# Patient Record
Sex: Female | Born: 1995 | Race: White | Hispanic: No | Smoking: Current every day smoker
Health system: Southern US, Community
[De-identification: ages and names within clinical notes are randomized; demographics above are authoritative.]

## PROBLEM LIST (undated history)

## (undated) DIAGNOSIS — F32A Depression, unspecified: Secondary | ICD-10-CM

## (undated) DIAGNOSIS — F909 Attention-deficit hyperactivity disorder, unspecified type: Secondary | ICD-10-CM

## (undated) HISTORY — PX: COLONOSCOPY: SHX174

---

## 2017-02-13 ENCOUNTER — Emergency Department
Admission: EM | Admit: 2017-02-13 | Discharge: 2017-02-13 | Disposition: A | Discharge status: Home / Self Care | Payer: Medicaid Other | Attending: Emergency Medicine | Admitting: Emergency Medicine

## 2017-02-13 DIAGNOSIS — K529 Noninfective gastroenteritis and colitis, unspecified: Secondary | ICD-10-CM

## 2017-02-13 DIAGNOSIS — R197 Diarrhea, unspecified: Secondary | ICD-10-CM | POA: Diagnosis not present

## 2017-02-13 DIAGNOSIS — K625 Hemorrhage of anus and rectum: Secondary | ICD-10-CM | POA: Diagnosis present

## 2017-02-13 LAB — CBC
HCT: 35.9 % (ref 35.0–47.0)
HEMOGLOBIN: 12 g/dL (ref 12.0–16.0)
MCH: 28.1 pg (ref 26.0–34.0)
MCHC: 33.3 g/dL (ref 32.0–36.0)
MCV: 84.2 fL (ref 80.0–100.0)
Platelets: 371 10*3/uL (ref 150–440)
RBC: 4.26 MIL/uL (ref 3.80–5.20)
RDW: 14.4 % (ref 11.5–14.5)
WBC: 9.6 10*3/uL (ref 3.6–11.0)

## 2017-02-13 LAB — URINALYSIS, COMPLETE (UACMP) WITH MICROSCOPIC
BILIRUBIN URINE: NEGATIVE
Bacteria, UA: NONE SEEN
GLUCOSE, UA: NEGATIVE mg/dL
HGB URINE DIPSTICK: NEGATIVE
KETONES UR: NEGATIVE mg/dL
LEUKOCYTES UA: NEGATIVE
NITRITE: NEGATIVE
PROTEIN: NEGATIVE mg/dL
Specific Gravity, Urine: 1.029 (ref 1.005–1.030)
pH: 6 (ref 5.0–8.0)

## 2017-02-13 LAB — COMPREHENSIVE METABOLIC PANEL
ALK PHOS: 73 U/L (ref 38–126)
ALT: 21 U/L (ref 14–54)
ANION GAP: 8 (ref 5–15)
AST: 19 U/L (ref 15–41)
Albumin: 3.9 g/dL (ref 3.5–5.0)
BUN: 11 mg/dL (ref 6–20)
CALCIUM: 9.2 mg/dL (ref 8.9–10.3)
CO2: 28 mmol/L (ref 22–32)
Chloride: 105 mmol/L (ref 101–111)
Creatinine, Ser: 0.79 mg/dL (ref 0.44–1.00)
GFR calc non Af Amer: >60 mL/min (ref >60)
Glucose, Bld: 89 mg/dL (ref 65–99)
Potassium: 3.9 mmol/L (ref 3.5–5.1)
SODIUM: 141 mmol/L (ref 135–145)
TOTAL PROTEIN: 7.8 g/dL (ref 6.5–8.1)
Total Bilirubin: 0.4 mg/dL (ref 0.3–1.2)

## 2017-02-13 LAB — POCT PREGNANCY, URINE: Preg Test, Ur: NEGATIVE

## 2017-02-13 LAB — LIPASE, BLOOD: Lipase: 31 U/L (ref 11–51)

## 2017-02-13 MED ORDER — SODIUM CHLORIDE 0.9 % IV BOLUS (SEPSIS)
1000.0000 mL | Freq: Once | INTRAVENOUS | Status: AC
  Administered 2017-02-13: 1000 mL via INTRAVENOUS

## 2017-02-13 NOTE — ED Notes (Signed)
Pt ambulated to waiting area. Protocols sent to lab.

## 2017-02-13 NOTE — ED Provider Notes (Signed)
[facility] Emergency Department Provider Note  ____________________________________________  Time seen: Approximately 10:16 AM  I have reviewed the triage vital signs and the nursing notes.   HISTORY  Chief Complaint Rectal Bleeding and Abdominal Pain   HPI Frances Walton is a 21 y.o. female no significant past medical history who presents for evaluation of rectal bleeding and diarrhea. Patient reports a year and a half of chronic diarrhea. She has been averaging 6 episodes a day of watery diarrhea. Since July she has been having intermittent episodes of bloody stool. For the last week she reports daily episodes of hematochezia. No melena. Last episode of hematochezia was 2 days ago. She has had nausea but no vomiting. She takes NSAIDs occasionally for headaches. No history of peptic ulcer disease. Patient is under a lot of stress. She has not been able to identify any food triggers. No history of C. difficile or recent antibiotics. She has tried Imodium which helps for about 10 hours but diarrhea then recurs. No family history of IBS, celiac, or IBD. She has not seen a GI doctor. For the last week she has been feeling more fatigued and has had intermittent episodes of dizziness when she stands up. She also endorses crampy abdominal pain that usually precedes her bowel movements that also been going on for a year and a half. She says that the pain comes on is severe last a few minutes and resolved after she has a bowel movement. The pain is generalized. She has no pain at this time.  No past medical history on file.  There are no active problems to display for this patient.   No past surgical history on file.  Prior to Admission medications   Not on File    Allergies Patient has no known allergies.  No family history on file.  Social History Social History  Substance Use Topics  . Smoking status: Not on file  . Smokeless tobacco: Not on file  .  Alcohol use Not on file    Review of Systems  Constitutional: Negative for fever. + Lightheaded Eyes: Negative for visual changes. ENT: Negative for sore throat. Neck: No neck pain  Cardiovascular: Negative for chest pain. Respiratory: Negative for shortness of breath. Gastrointestinal: + diffuse cramping abdominal pain, nausea, hematochezia and diarrhea. Genitourinary: Negative for dysuria. Musculoskeletal: Negative for back pain. Skin: Negative for rash. Neurological: Negative for headaches, weakness or numbness. Psych: No SI or HI  ____________________________________________   PHYSICAL EXAM:  VITAL SIGNS: ED Triage Vitals  Enc Vitals Group     BP 02/13/17 0921 101/62     Pulse Rate 02/13/17 0921 98     Resp 02/13/17 0921 13     Temp 02/13/17 0921 98.1 F (36.7 C)     Temp Source 02/13/17 0921 Oral     SpO2 02/13/17 0921 98 %     Weight 02/13/17 0921 185 lb (83.9 kg)     Height 02/13/17 0921 5' 11"  (1.803 m)     Head Circumference --      Peak Flow --      Pain Score 02/13/17 0851 2     Pain Loc --      Pain Edu? --      Excl. in [place]? --     Constitutional: Alert and oriented. Well appearing and in no apparent distress. HEENT:      Head: Normocephalic and atraumatic.         Eyes: Conjunctivae are normal. Sclera  is non-icteric.       Mouth/Throat: Mucous membranes are moist.       Neck: Supple with no signs of meningismus. Cardiovascular: Regular rate and rhythm. No murmurs, gallops, or rubs. 2+ symmetrical distal pulses are present in all extremities. No JVD. Respiratory: Normal respiratory effort. Lungs are clear to auscultation bilaterally. No wheezes, crackles, or rhonchi.  Gastrointestinal: Soft, non tender, and non distended with positive bowel sounds. No rebound or guarding. Dark brown stool guaiac negative Musculoskeletal: Nontender with normal range of motion in all extremities. No edema, cyanosis, or erythema of extremities. Neurologic: Normal speech  and language. Face is symmetric. Moving all extremities. No gross focal neurologic deficits are appreciated. Skin: Skin is warm, dry and intact. No rash noted. Psychiatric: Mood and affect are normal. Speech and behavior are normal.  ____________________________________________   LABS (all labs ordered are listed, but only abnormal results are displayed)  Labs Reviewed  URINALYSIS, COMPLETE (UACMP) WITH MICROSCOPIC - Abnormal; Notable for the following:       Result Value   Color, Urine YELLOW (*)    APPearance CLOUDY (*)    Squamous Epithelial / LPF 6-30 (*)    All other components within normal limits  LIPASE, BLOOD  COMPREHENSIVE METABOLIC PANEL  CBC  POC URINE PREG, ED  POCT PREGNANCY, URINE   ____________________________________________  EKG  none  ____________________________________________  RADIOLOGY  none  ____________________________________________   PROCEDURES  Procedure(s) performed: None Procedures Critical Care performed:  None ____________________________________________   INITIAL IMPRESSION / ASSESSMENT AND PLAN / ED COURSE   21 y.o. female no significant past medical history who presents for evaluation of chronic diarrhea, intermittent abdominal cramping, and hematochezia. Patient's symptoms have been ongoing for 1.5 years although hematochezia his more recent. No melena. No NSAID use. Abdomen is soft with no tenderness throughout. Stool dark brown guaiac negative. Vitals are stable. Hemoglobin is normal at 12.0, kidney function and electrolytes are within normal limits. No indication for CT scan at this time based on history or physical exam. I will refer patient to a GI specialist for further evaluation. Discussed signs and symptoms of acute blood loss anemia and recommended that she returns to the emergency room if these develop. Recommended staying away from NSAIDs. Recommended dietary changes including removing lactose and gluten from her diet and  to she is able to see GI.     Pertinent labs & imaging results that were available during my care of the patient were reviewed by me and considered in my medical decision making (see chart for details).    ____________________________________________   FINAL CLINICAL IMPRESSION(S) / ED DIAGNOSES  Final diagnoses:  Chronic diarrhea      NEW MEDICATIONS STARTED DURING THIS VISIT:  New Prescriptions   No medications on file     Note:  This document was prepared using Dragon voice recognition software and may include unintentional dictation errors.    Rudene Re, MD 02/13/17 [id number]

## 2017-02-13 NOTE — Discharge Instructions (Signed)
Follow-up with a GI specialist within the next month. Return to the emergency room if you develop severe fatigue, shortness of breath, if you pass out, or if you are concerned that your bleeding is getting worse.

## 2017-02-13 NOTE — ED Triage Notes (Signed)
Pt reports diarrhea x1 year, reports she's had blood in her stool since july and lower abdominal pain x2 weeks.

## 2017-02-21 ENCOUNTER — Other Ambulatory Visit
Admission: RE | Admit: 2017-02-21 | Discharge: 2017-02-21 | Disposition: A | Discharge status: Home / Self Care | Payer: Medicaid Other | Source: Ambulatory Visit | Attending: Student | Admitting: Student

## 2017-02-21 DIAGNOSIS — K529 Noninfective gastroenteritis and colitis, unspecified: Secondary | ICD-10-CM | POA: Insufficient documentation

## 2017-02-21 LAB — GASTROINTESTINAL PANEL BY PCR, STOOL (REPLACES STOOL CULTURE)
ADENOVIRUS F40/41: NOT DETECTED
ASTROVIRUS: NOT DETECTED
CAMPYLOBACTER SPECIES: NOT DETECTED
CYCLOSPORA CAYETANENSIS: NOT DETECTED
Cryptosporidium: NOT DETECTED
ENTEROPATHOGENIC E COLI (EPEC): DETECTED — AB
ENTEROTOXIGENIC E COLI (ETEC): NOT DETECTED
Entamoeba histolytica: NOT DETECTED
Enteroaggregative E coli (EAEC): NOT DETECTED
Giardia lamblia: NOT DETECTED
NOROVIRUS GI/GII: NOT DETECTED
PLESIMONAS SHIGELLOIDES: NOT DETECTED
Rotavirus A: NOT DETECTED
Salmonella species: NOT DETECTED
Sapovirus (I, II, IV, and V): NOT DETECTED
Shiga like toxin producing E coli (STEC): NOT DETECTED
Shigella/Enteroinvasive E coli (EIEC): NOT DETECTED
VIBRIO CHOLERAE: NOT DETECTED
VIBRIO SPECIES: NOT DETECTED
Yersinia enterocolitica: NOT DETECTED

## 2017-02-24 LAB — MISC LABCORP TEST (SEND OUT): Labcorp test code: 86207

## 2017-03-02 ENCOUNTER — Other Ambulatory Visit
Admission: RE | Admit: 2017-03-02 | Discharge: 2017-03-02 | Disposition: A | Discharge status: Home / Self Care | Payer: Medicaid Other | Source: Ambulatory Visit | Attending: Student | Admitting: Student

## 2017-03-02 DIAGNOSIS — A498 Other bacterial infections of unspecified site: Secondary | ICD-10-CM | POA: Diagnosis not present

## 2017-03-02 LAB — GASTROINTESTINAL PANEL BY PCR, STOOL (REPLACES STOOL CULTURE)
ADENOVIRUS F40/41: NOT DETECTED
Astrovirus: NOT DETECTED
CAMPYLOBACTER SPECIES: NOT DETECTED
CRYPTOSPORIDIUM: NOT DETECTED
CYCLOSPORA CAYETANENSIS: NOT DETECTED
ENTAMOEBA HISTOLYTICA: NOT DETECTED
ENTEROPATHOGENIC E COLI (EPEC): NOT DETECTED
Enteroaggregative E coli (EAEC): NOT DETECTED
Enterotoxigenic E coli (ETEC): NOT DETECTED
Giardia lamblia: NOT DETECTED
Norovirus GI/GII: NOT DETECTED
PLESIMONAS SHIGELLOIDES: NOT DETECTED
Rotavirus A: NOT DETECTED
SAPOVIRUS (I, II, IV, AND V): NOT DETECTED
Salmonella species: NOT DETECTED
Shiga like toxin producing E coli (STEC): NOT DETECTED
Shigella/Enteroinvasive E coli (EIEC): NOT DETECTED
VIBRIO SPECIES: NOT DETECTED
Vibrio cholerae: NOT DETECTED
YERSINIA ENTEROCOLITICA: NOT DETECTED

## 2017-03-03 LAB — CALPROTECTIN, FECAL: Calprotectin, Fecal: 815 ug/g — ABNORMAL HIGH (ref 0–120)

## 2017-05-30 ENCOUNTER — Other Ambulatory Visit
Admission: RE | Admit: 2017-05-30 | Discharge: 2017-05-30 | Disposition: A | Discharge status: Home / Self Care | Payer: Medicaid Other | Source: Ambulatory Visit | Attending: Student | Admitting: Student

## 2017-05-30 DIAGNOSIS — R197 Diarrhea, unspecified: Secondary | ICD-10-CM | POA: Diagnosis not present

## 2017-05-30 LAB — GASTROINTESTINAL PANEL BY PCR, STOOL (REPLACES STOOL CULTURE)

## 2017-05-30 LAB — C DIFFICILE QUICK SCREEN W PCR REFLEX
C DIFFICILE (CDIFF) TOXIN: NEGATIVE
C Diff antigen: POSITIVE — AB

## 2017-05-30 LAB — CLOSTRIDIUM DIFFICILE BY PCR: Toxigenic C. Difficile by PCR: POSITIVE — AB

## 2017-06-02 LAB — CALPROTECTIN, FECAL: Calprotectin, Fecal: 338 ug/g — ABNORMAL HIGH (ref 0–120)

## 2018-02-03 ENCOUNTER — Other Ambulatory Visit
Admission: RE | Admit: 2018-02-03 | Discharge: 2018-02-03 | Disposition: A | Discharge status: Home / Self Care | Payer: Medicaid Other | Source: Ambulatory Visit | Attending: Student | Admitting: Student

## 2018-02-03 DIAGNOSIS — K51519 Left sided colitis with unspecified complications: Secondary | ICD-10-CM | POA: Insufficient documentation

## 2018-02-03 LAB — C DIFFICILE QUICK SCREEN W PCR REFLEX
C Diff antigen: POSITIVE — AB
C Diff toxin: NEGATIVE

## 2018-02-03 LAB — CLOSTRIDIUM DIFFICILE BY PCR, REFLEXED: Toxigenic C. Difficile by PCR: POSITIVE — AB

## 2018-02-07 LAB — CALPROTECTIN, FECAL: Calprotectin, Fecal: 79 ug/g (ref 0–120)

## 2020-03-19 ENCOUNTER — Other Ambulatory Visit: Payer: Self-pay

## 2020-03-19 ENCOUNTER — Other Ambulatory Visit
Admission: RE | Admit: 2020-03-19 | Discharge: 2020-03-19 | Disposition: A | Discharge status: Home / Self Care | Payer: Medicaid Other | Source: Ambulatory Visit | Attending: Gastroenterology | Admitting: Gastroenterology

## 2020-03-19 DIAGNOSIS — Z20822 Contact with and (suspected) exposure to covid-19: Secondary | ICD-10-CM | POA: Diagnosis not present

## 2020-03-19 DIAGNOSIS — Z01812 Encounter for preprocedural laboratory examination: Secondary | ICD-10-CM | POA: Diagnosis present

## 2020-03-20 ENCOUNTER — Encounter: Payer: Self-pay | Admitting: *Deleted

## 2020-03-20 LAB — SARS CORONAVIRUS 2 (TAT 6-24 HRS): SARS Coronavirus 2: NEGATIVE

## 2020-03-21 ENCOUNTER — Emergency Department
Admission: EM | Admit: 2020-03-21 | Discharge: 2020-03-21 | Disposition: A | Discharge status: Home / Self Care | Payer: Medicaid Other | Attending: Emergency Medicine | Admitting: Emergency Medicine

## 2020-03-21 ENCOUNTER — Other Ambulatory Visit: Payer: Self-pay

## 2020-03-21 ENCOUNTER — Emergency Department: Payer: Medicaid Other

## 2020-03-21 DIAGNOSIS — W19XXXA Unspecified fall, initial encounter: Secondary | ICD-10-CM | POA: Insufficient documentation

## 2020-03-21 DIAGNOSIS — Z91040 Latex allergy status: Secondary | ICD-10-CM | POA: Diagnosis not present

## 2020-03-21 DIAGNOSIS — R300 Dysuria: Secondary | ICD-10-CM | POA: Insufficient documentation

## 2020-03-21 DIAGNOSIS — S93401A Sprain of unspecified ligament of right ankle, initial encounter: Secondary | ICD-10-CM | POA: Insufficient documentation

## 2020-03-21 DIAGNOSIS — N3090 Cystitis, unspecified without hematuria: Secondary | ICD-10-CM | POA: Diagnosis not present

## 2020-03-21 DIAGNOSIS — T1490XA Injury, unspecified, initial encounter: Secondary | ICD-10-CM

## 2020-03-21 DIAGNOSIS — N390 Urinary tract infection, site not specified: Secondary | ICD-10-CM

## 2020-03-21 DIAGNOSIS — S99911A Unspecified injury of right ankle, initial encounter: Secondary | ICD-10-CM | POA: Diagnosis present

## 2020-03-21 LAB — POCT PREGNANCY, URINE: Preg Test, Ur: NEGATIVE

## 2020-03-21 LAB — URINALYSIS, ROUTINE W REFLEX MICROSCOPIC
Glucose, UA: NEGATIVE mg/dL
Hgb urine dipstick: NEGATIVE
Ketones, ur: NEGATIVE mg/dL
Leukocytes,Ua: NEGATIVE
Nitrite: NEGATIVE
Protein, ur: 100 mg/dL — AB
Specific Gravity, Urine: 1.041 — ABNORMAL HIGH (ref 1.005–1.030)
pH: 5 (ref 5.0–8.0)

## 2020-03-21 IMAGING — CR DG ANKLE COMPLETE 3+V*R*
1 series · 3 of 3 positions shown · non-contrast
Comparison: None.

CLINICAL DATA: Twisted right ankle

EXAM:
RIGHT ANKLE - COMPLETE 3+ VIEW

[Series 1: dg ankle complete right · 0.14mm/px · 3 of 3 slices shown]
[im 1/3]
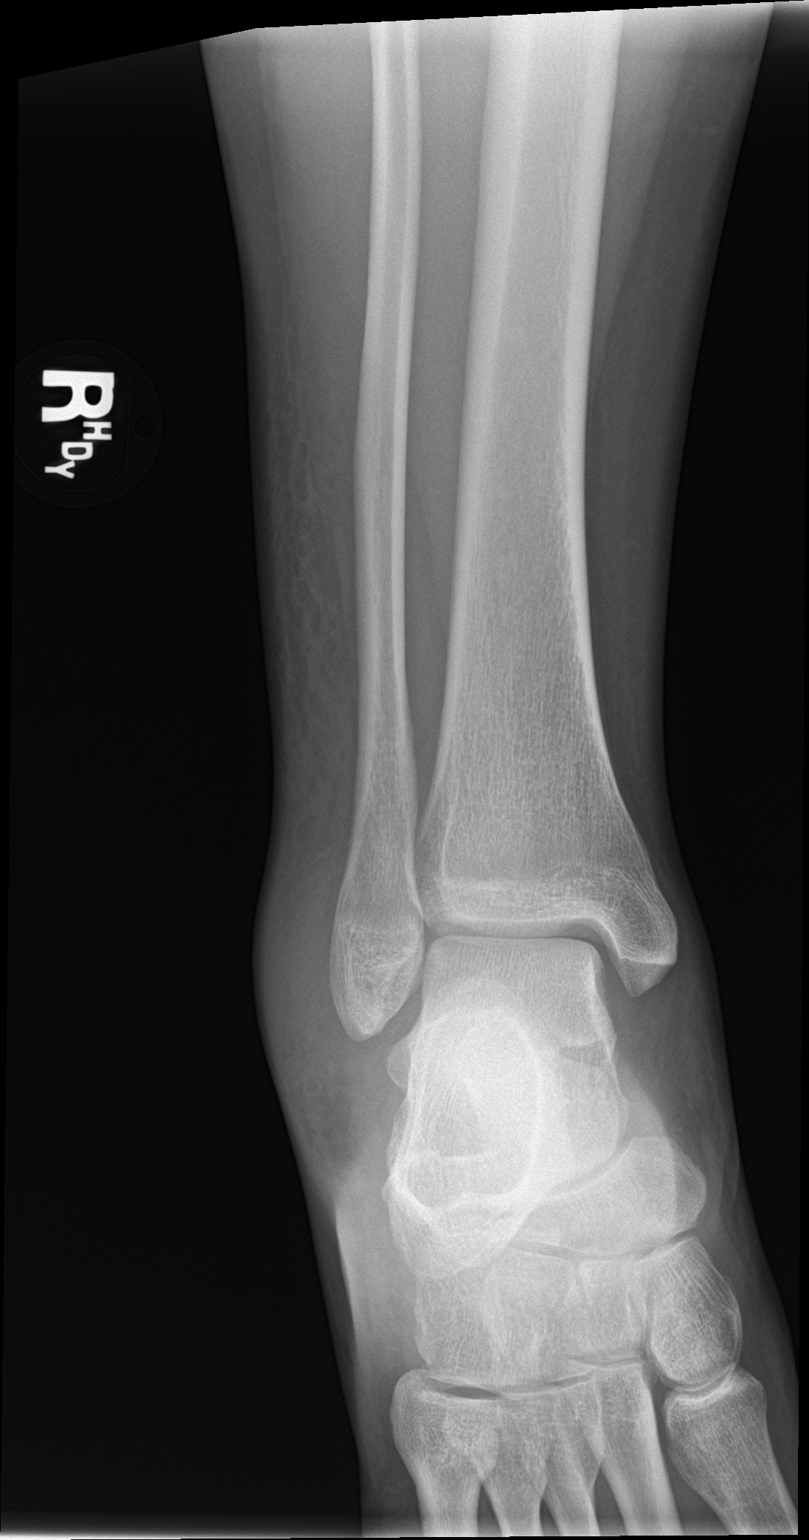
[im 2/3]
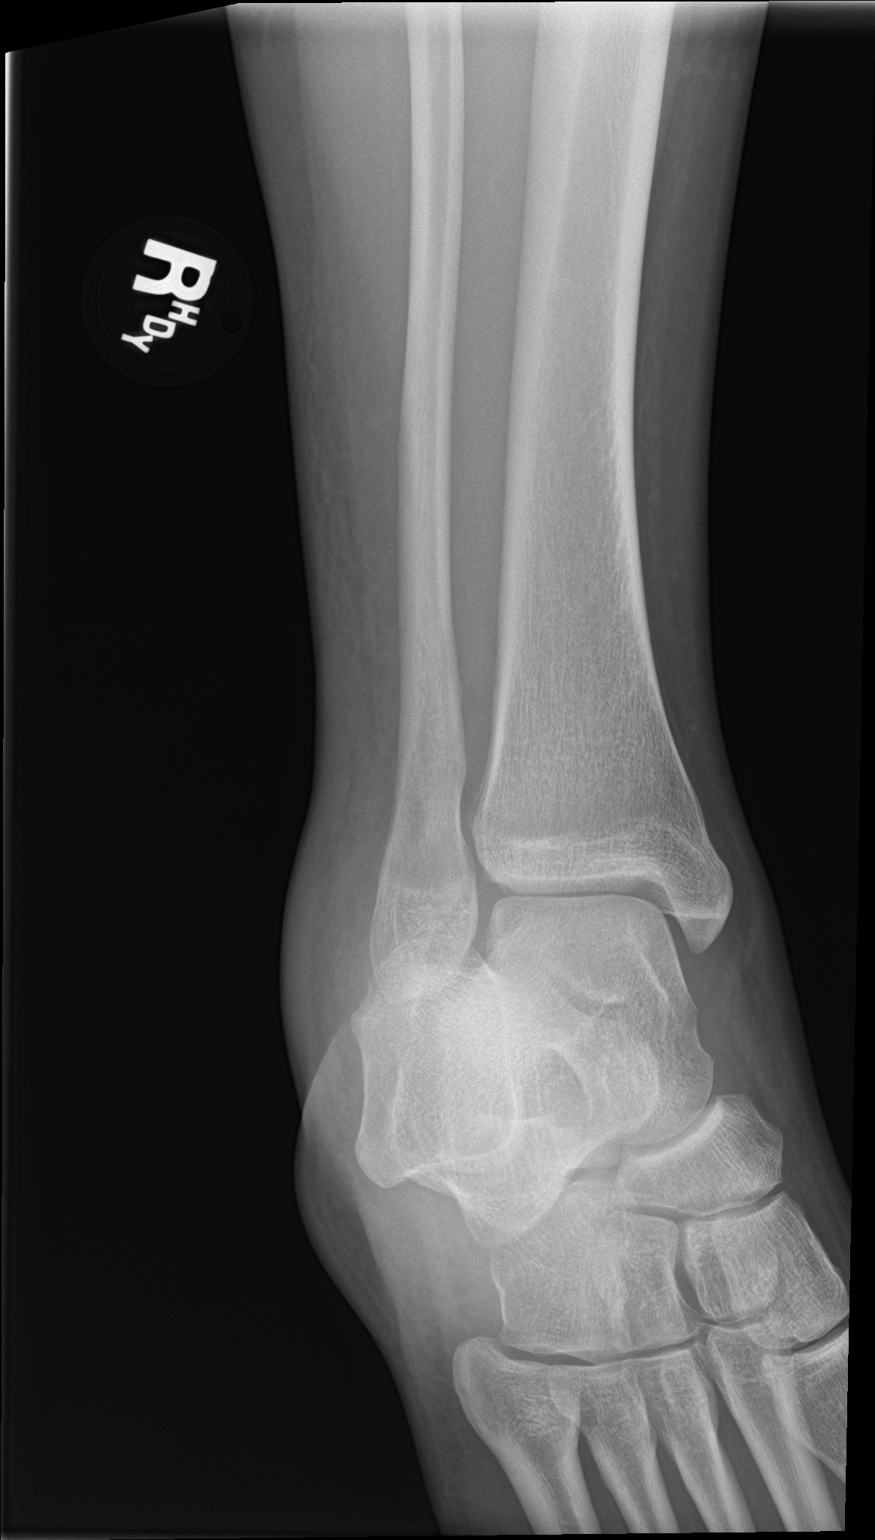
[im 3/3]
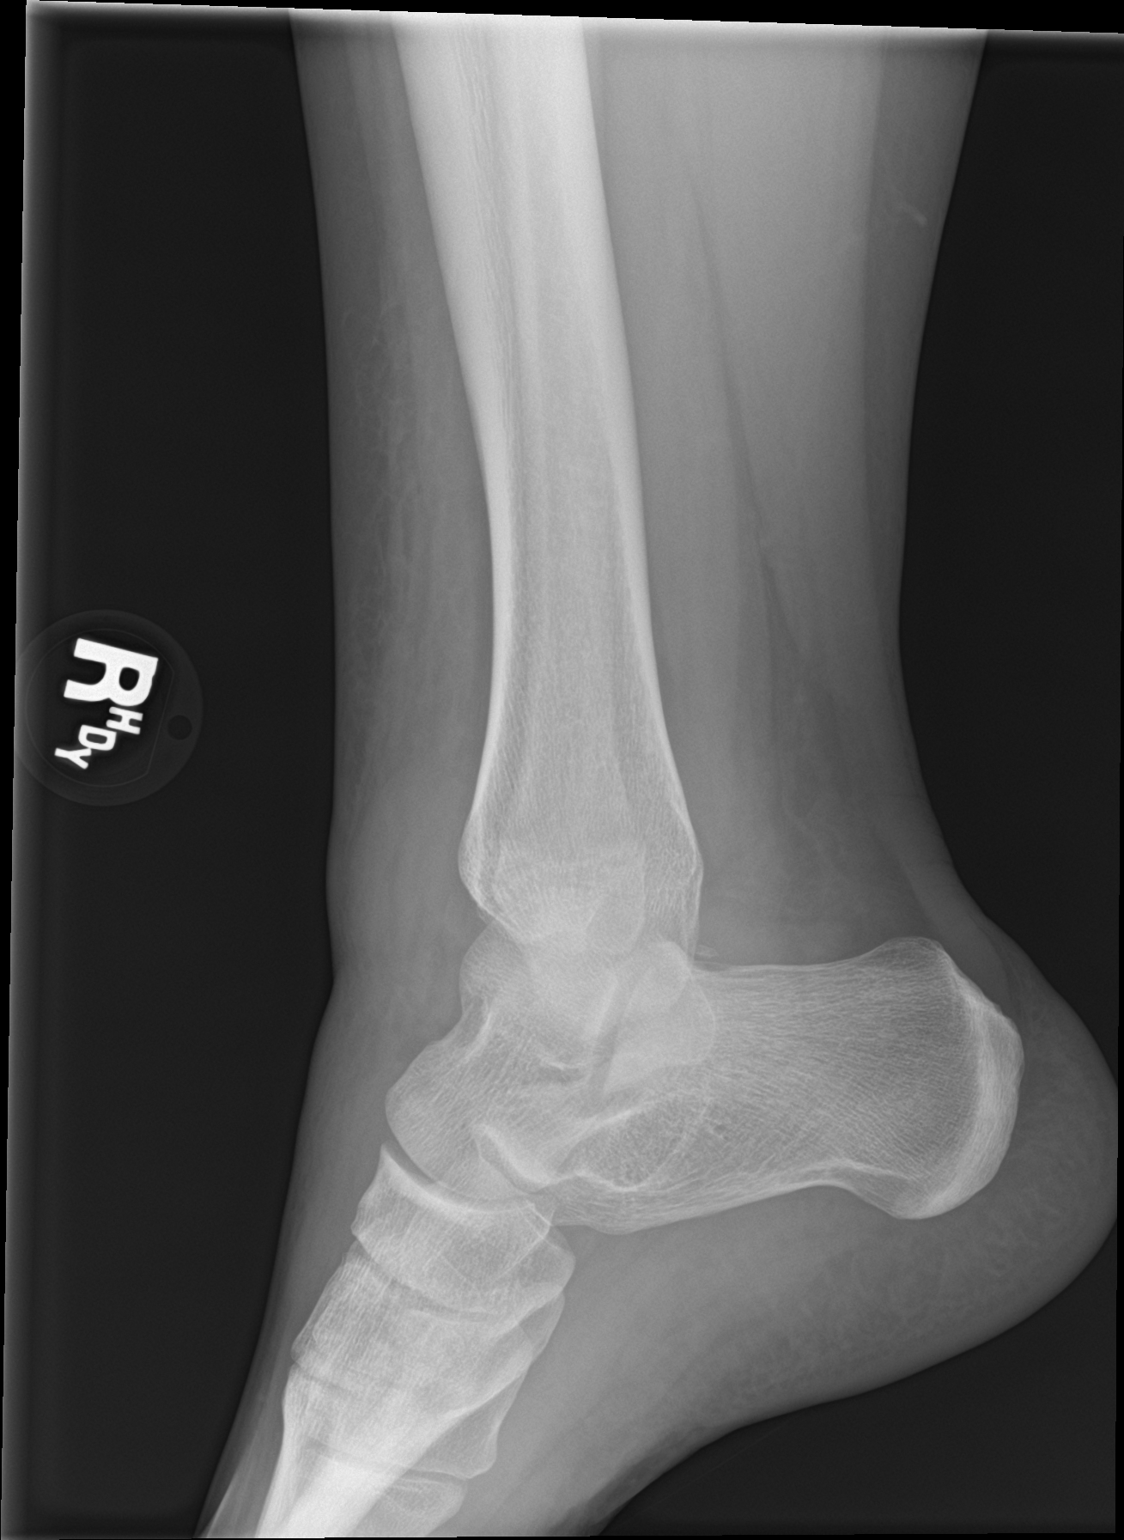

[3 of 3 positions shown; findings below may reference images not displayed]

FINDINGS: There is no evidence of fracture, dislocation, or joint effusion.
There is no evidence of arthropathy or other focal bone abnormality.
Diffuse soft tissue swelling seen along the lateral malleolus. A
small ankle joint effusion is present.
IMPRESSION: No acute osseous abnormality.

## 2020-03-21 MED ORDER — ONDANSETRON 8 MG PO TBDP
8.0000 mg | ORAL_TABLET | Freq: Once | ORAL | Status: AC
  Administered 2020-03-21: 8 mg via ORAL
  Filled 2020-03-21: qty 1

## 2020-03-21 MED ORDER — ONDANSETRON HCL 8 MG PO TABS: 8.0000 mg | ORAL_TABLET | Freq: Three times a day (TID) | ORAL | 0 refills | Status: DC | PRN

## 2020-03-21 MED ORDER — HYDROMORPHONE HCL 1 MG/ML IJ SOLN
1.0000 mg | Freq: Once | INTRAMUSCULAR | Status: AC
  Administered 2020-03-21: 1 mg via INTRAMUSCULAR
  Filled 2020-03-21: qty 1

## 2020-03-21 MED ORDER — FLUCONAZOLE 150 MG PO TABS: 150.0000 mg | ORAL_TABLET | Freq: Every day | ORAL | 0 refills | Status: DC

## 2020-03-21 MED ORDER — SULFAMETHOXAZOLE-TRIMETHOPRIM 800-160 MG PO TABS: 1.0000 | ORAL_TABLET | Freq: Two times a day (BID) | ORAL | 0 refills | Status: DC

## 2020-03-21 MED ORDER — PHENAZOPYRIDINE HCL 200 MG PO TABS: 200.0000 mg | ORAL_TABLET | Freq: Three times a day (TID) | ORAL | 0 refills | Status: DC | PRN

## 2020-03-21 MED ORDER — OXYCODONE-ACETAMINOPHEN 7.5-325 MG PO TABS
1.0000 | ORAL_TABLET | Freq: Four times a day (QID) | ORAL | 0 refills | Status: DC | PRN
Start: 2020-03-21 — End: 2021-03-30

## 2020-03-21 NOTE — ED Triage Notes (Signed)
Pt comes with right ankle pain and swelling after a fall yesterday

## 2020-03-21 NOTE — ED Notes (Signed)
See triage note- pt fell down on a steep drive way onto right ankle. Ankle is swollen and bruised, unable to bare weight on ankle.   Pt also thinks she has a UTI, states she has dysuria and urinary frequency since yesterday.

## 2020-03-21 NOTE — Discharge Instructions (Signed)
Follow discharge care instruction.  Wear splint and ambulate with crutches for 2 to 3 days.  Follow-up with orthopedic if no improvement or worsening of ankle pain.  Be advised medication may cause drowsiness.

## 2020-03-21 NOTE — ED Provider Notes (Signed)
[facility] Emergency Department Provider Note   ____________________________________________   First MD Initiated Contact with Patient 03/21/20 1617     (approximate)  I have reviewed the triage vital signs and the nursing notes.   HISTORY  Chief Complaint Ankle Pain    HPI Frances Walton is a 24 y.o. female patient complaining right ankle pain and swelling secondary to fall yesterday.  Patient unable to bear weight today secondary to pain.  Patient denies loss sensation loss of movement.  LOC or head injury status post fall.  Patient rates pain as 7/10.  Patient described the pain as "achy".  Ice pack applied in triage.         History reviewed. No pertinent past medical history.  There are no problems to display for this patient.   History reviewed. No pertinent surgical history.  Prior to Admission medications   Medication Sig Start Date End Date Taking? Authorizing Provider  buPROPion (WELLBUTRIN SR) 100 MG 12 hr tablet Take 100 mg by mouth 2 (two) times daily.    [provider]  fluconazole (DIFLUCAN) 150 MG tablet Take 1 tablet (150 mg total) by mouth daily. 03/21/20   Sable Feil, PA-C  mesalamine (LIALDA) 1.2 g EC tablet Take 1.2 g by mouth daily with breakfast.    [provider]  oxyCODONE-acetaminophen (PERCOCET) 7.5-325 MG tablet Take 1 tablet by mouth every 6 (six) hours as needed. 03/21/20   Sable Feil, PA-C  phenazopyridine (PYRIDIUM) 200 MG tablet Take 1 tablet (200 mg total) by mouth 3 (three) times daily as needed for pain. 03/21/20   Sable Feil, PA-C  sulfamethoxazole-trimethoprim (BACTRIM DS) 800-160 MG tablet Take 1 tablet by mouth 2 (two) times daily. 03/21/20   Sable Feil, PA-C    Allergies Latex and Nickel  History reviewed. No pertinent family history.  Social History Social History   Tobacco Use   Smoking status: Not on file  Substance Use Topics   Alcohol use: Not on file    Drug use: Not on file    Review of Systems Constitutional: No fever/chills Eyes: No visual changes. ENT: No sore throat. Cardiovascular: Denies chest pain. Respiratory: Denies shortness of breath. Gastrointestinal: No abdominal pain.  No nausea, no vomiting.  No diarrhea.  No constipation.  History of ulcerative colitis. Genitourinary: Positive for dysuria. Musculoskeletal: Negative for back pain. Skin: Negative for rash. Neurological: Negative for headaches, focal weakness or numbness. Allergic/Immunilogical: Latex and nickel. ____________________________________________   PHYSICAL EXAM:  VITAL SIGNS: ED Triage Vitals  Enc Vitals Group     BP 03/21/20 1601 108/60     Pulse Rate 03/21/20 1601 83     Resp 03/21/20 1601 16     Temp 03/21/20 1601 98.2 F (36.8 C)     Temp Source 03/21/20 1601 Oral     SpO2 03/21/20 1601 99 %     Weight 03/21/20 1600 180 lb (81.6 kg)     Height 03/21/20 1600 5' 11"  (1.803 m)     Head Circumference --      Peak Flow --      Pain Score 03/21/20 1604 7     Pain Loc --      Pain Edu? --      Excl. in [place]? --     Constitutional: Alert and oriented. Well appearing and in no acute distress.  Anxious. Neck: No cervical spine tenderness to palpation. Hematological/Lymphatic/Immunilogical: No cervical lymphadenopathy. Cardiovascular: Normal rate, regular rhythm. Grossly  normal heart sounds.  Good peripheral circulation. Respiratory: Normal respiratory effort.  No retractions. Lungs CTAB. Musculoskeletal: No obvious deformity to the right ankle.  Patient has moderate edema to the lateral ankle. Neurologic:  Normal speech and language. No gross focal neurologic deficits are appreciated. No gait instability. Skin:  Skin is warm, dry and intact. No rash noted.  No abrasion or ecchymosis. Psychiatric: Mood and affect are normal. Speech and behavior are normal.  ____________________________________________   LABS (all labs ordered are listed, but only  abnormal results are displayed)  Labs Reviewed  URINALYSIS, ROUTINE W REFLEX MICROSCOPIC - Abnormal; Notable for the following components:      Result Value   Color, Urine YELLOW (*)    APPearance TURBID (*)    Specific Gravity, Urine 1.041 (*)    Bilirubin Urine SMALL (*)    Protein, ur 100 (*)    Bacteria, UA FEW (*)    All other components within normal limits  POCT PREGNANCY, URINE   ____________________________________________  EKG   ____________________________________________  RADIOLOGY  ED MD interpretation:    Official radiology report(s): DG Ankle Complete Right  Result Date: 03/21/2020 CLINICAL DATA:  Twisted right ankle EXAM: RIGHT ANKLE - COMPLETE 3+ VIEW COMPARISON:  None. FINDINGS: There is no evidence of fracture, dislocation, or joint effusion. There is no evidence of arthropathy or other focal bone abnormality. Diffuse soft tissue swelling seen along the lateral malleolus. A small ankle joint effusion is present. IMPRESSION: No acute osseous abnormality. Electronically Signed   By: Prudencio Pair M.D.   On: 03/21/2020 16:41    ____________________________________________   PROCEDURES  Procedure(s) performed (including Critical Care):  Procedures   ____________________________________________   INITIAL IMPRESSION / ASSESSMENT AND PLAN / ED COURSE  As part of my medical decision making, I reviewed the following data within the [place]     Patient presents with right ankle pain secondary to fall.  Discussed x-ray findings with patient.  Patient complaint physical and exam consistent with ankle sprain.  Patient placed in a ankle splint and given crutches to assist with ambulation.  Patient given a work note for 3 days.  Patient advised follow-up orthopedic if no improvement in 3 days.  Patient also was found to have a urinary tract infection.  Patient given prescription for Bactrim DS, Pyridium, and Diflucan.  Take medication as  directed.          ____________________________________________   FINAL CLINICAL IMPRESSION(S) / ED DIAGNOSES  Final diagnoses:  Injury  Sprain of right ankle, unspecified ligament, initial encounter  Urinary tract infection without hematuria, site unspecified     ED Discharge Orders         Ordered    oxyCODONE-acetaminophen (PERCOCET) 7.5-325 MG tablet  Every 6 hours PRN        03/21/20 1719    sulfamethoxazole-trimethoprim (BACTRIM DS) 800-160 MG tablet  2 times daily        03/21/20 1756    phenazopyridine (PYRIDIUM) 200 MG tablet  3 times daily PRN        03/21/20 1756    fluconazole (DIFLUCAN) 150 MG tablet  Daily        03/21/20 1756          *Please note:  Avian Greenawalt was evaluated in Emergency Department on 03/21/2020 for the symptoms described in the history of present illness. She was evaluated in the context of the global COVID-19 pandemic, which necessitated consideration that the patient might be  at risk for infection with the SARS-CoV-2 virus that causes COVID-19. Institutional protocols and algorithms that pertain to the evaluation of patients at risk for COVID-19 are in a state of rapid change based on information released by regulatory bodies including the CDC and federal and state organizations. These policies and algorithms were followed during the patient's care in the ED.  Some ED evaluations and interventions may be delayed as a result of limited staffing during and the pandemic.*   Note:  This document was prepared using Dragon voice recognition software and may include unintentional dictation errors.    Sable Feil, PA-C 03/21/20 1800    Harvest Dark, MD 03/21/20 2057

## 2020-03-23 ENCOUNTER — Encounter: Payer: Self-pay | Admitting: Anesthesiology

## 2020-03-23 ENCOUNTER — Ambulatory Visit: Admission: RE | Admit: 2020-03-23 | Payer: Medicaid Other | Source: Home / Self Care

## 2020-03-23 MED ORDER — PROPOFOL 500 MG/50ML IV EMUL
INTRAVENOUS | Status: AC
  Filled 2020-03-23: qty 50

## 2020-03-23 MED ORDER — MIDAZOLAM HCL 2 MG/2ML IJ SOLN
INTRAMUSCULAR | Status: AC
  Filled 2020-03-23: qty 2

## 2020-05-06 ENCOUNTER — Other Ambulatory Visit: Admission: RE | Admit: 2020-05-06 | Payer: Medicaid Other | Source: Ambulatory Visit

## 2020-05-08 ENCOUNTER — Encounter: Admission: RE | Payer: Self-pay | Source: Home / Self Care

## 2020-05-08 ENCOUNTER — Ambulatory Visit: Admission: RE | Admit: 2020-05-08 | Payer: Medicaid Other | Source: Home / Self Care

## 2020-05-08 SURGERY — COLONOSCOPY
Anesthesia: General

## 2020-05-08 SURGERY — COLONOSCOPY WITH PROPOFOL
Anesthesia: General

## 2020-05-26 ENCOUNTER — Other Ambulatory Visit
Admission: RE | Admit: 2020-05-26 | Discharge: 2020-05-26 | Disposition: A | Discharge status: Home / Self Care | Payer: Medicaid Other | Source: Ambulatory Visit | Attending: Gastroenterology | Admitting: Gastroenterology

## 2020-05-26 DIAGNOSIS — Z5321 Procedure and treatment not carried out due to patient leaving prior to being seen by health care provider: Secondary | ICD-10-CM | POA: Diagnosis not present

## 2020-05-26 LAB — GASTROINTESTINAL PANEL BY PCR, STOOL (REPLACES STOOL CULTURE)

## 2020-05-26 LAB — C DIFFICILE QUICK SCREEN W PCR REFLEX
C Diff antigen: POSITIVE — AB
C Diff toxin: NEGATIVE

## 2020-05-26 LAB — CLOSTRIDIUM DIFFICILE BY PCR, REFLEXED: Toxigenic C. Difficile by PCR: POSITIVE — AB

## 2021-03-29 ENCOUNTER — Emergency Department: Payer: Medicaid Other

## 2021-03-29 ENCOUNTER — Observation Stay
Admission: EM | Admit: 2021-03-29 | Discharge: 2021-03-30 | Disposition: A | Discharge status: Home / Self Care | Payer: Medicaid Other | Attending: Internal Medicine | Admitting: Internal Medicine

## 2021-03-29 ENCOUNTER — Observation Stay: Payer: Medicaid Other

## 2021-03-29 ENCOUNTER — Other Ambulatory Visit: Payer: Self-pay

## 2021-03-29 DIAGNOSIS — F32A Depression, unspecified: Secondary | ICD-10-CM

## 2021-03-29 DIAGNOSIS — K5100 Ulcerative (chronic) pancolitis without complications: Secondary | ICD-10-CM

## 2021-03-29 DIAGNOSIS — J22 Unspecified acute lower respiratory infection: Secondary | ICD-10-CM | POA: Diagnosis not present

## 2021-03-29 DIAGNOSIS — Z91040 Latex allergy status: Secondary | ICD-10-CM | POA: Insufficient documentation

## 2021-03-29 DIAGNOSIS — M5441 Lumbago with sciatica, right side: Secondary | ICD-10-CM

## 2021-03-29 DIAGNOSIS — J40 Bronchitis, not specified as acute or chronic: Secondary | ICD-10-CM

## 2021-03-29 DIAGNOSIS — M5459 Other low back pain: Principal | ICD-10-CM | POA: Diagnosis present

## 2021-03-29 DIAGNOSIS — M5442 Lumbago with sciatica, left side: Secondary | ICD-10-CM

## 2021-03-29 LAB — HCG, QUANTITATIVE, PREGNANCY: hCG, Beta Chain, Quant, S: <1 m[IU]/mL (ref <5)

## 2021-03-29 LAB — CBC WITH DIFFERENTIAL/PLATELET
Abs Immature Granulocytes: 0.07 10*3/uL (ref 0.00–0.07)
Basophils Absolute: 0 10*3/uL (ref 0.0–0.1)
Basophils Relative: 0 %
Eosinophils Absolute: 0 10*3/uL (ref 0.0–0.5)
Eosinophils Relative: 0 %
HCT: 39.7 % (ref 36.0–46.0)
Hemoglobin: 13.8 g/dL (ref 12.0–15.0)
Immature Granulocytes: 0 %
Lymphocytes Relative: 7 %
Lymphs Abs: 1.1 10*3/uL (ref 0.7–4.0)
MCH: 32.3 pg (ref 26.0–34.0)
MCHC: 34.8 g/dL (ref 30.0–36.0)
MCV: 93 fL (ref 80.0–100.0)
Monocytes Absolute: 0.9 10*3/uL (ref 0.1–1.0)
Monocytes Relative: 5 %
Neutro Abs: 14.2 10*3/uL — ABNORMAL HIGH (ref 1.7–7.7)
Neutrophils Relative %: 88 %
Platelets: 319 10*3/uL (ref 150–400)
RBC: 4.27 MIL/uL (ref 3.87–5.11)
RDW: 13.2 % (ref 11.5–15.5)
WBC: 16.3 10*3/uL — ABNORMAL HIGH (ref 4.0–10.5)
nRBC: 0 % (ref 0.0–0.2)

## 2021-03-29 LAB — COMPREHENSIVE METABOLIC PANEL
ALT: 15 U/L (ref 0–44)
AST: 23 U/L (ref 15–41)
Albumin: 3.9 g/dL (ref 3.5–5.0)
Alkaline Phosphatase: 70 U/L (ref 38–126)
Anion gap: 13 (ref 5–15)
BUN: 11 mg/dL (ref 6–20)
CO2: 22 mmol/L (ref 22–32)
Calcium: 9.4 mg/dL (ref 8.9–10.3)
Chloride: 103 mmol/L (ref 98–111)
Creatinine, Ser: 0.61 mg/dL (ref 0.44–1.00)
GFR, Estimated: >60 mL/min (ref >60)
Glucose, Bld: 77 mg/dL (ref 70–99)
Potassium: 4 mmol/L (ref 3.5–5.1)
Sodium: 138 mmol/L (ref 135–145)
Total Bilirubin: 1.4 mg/dL — ABNORMAL HIGH (ref 0.3–1.2)
Total Protein: 7.2 g/dL (ref 6.5–8.1)

## 2021-03-29 IMAGING — MR MR LUMBAR SPINE WO/W CM
1 series · 17 of 17 positions shown · IV contrast (7.5ml Gadavist)
Comparison: None.

CLINICAL DATA: Mid back pain with difficulty walking

EXAM:
MRI THORACIC AND LUMBAR SPINE WITHOUT AND WITH CONTRAST
TECHNIQUE: Multiplanar and multiecho pulse sequences of the thoracic and lumbar
spine were obtained without and with intravenous contrast.
CONTRAST:  7.5mL GADAVIST GADOBUTROL 1 MMOL/ML IV SOLN

[Series 26: T1 fat-sat · sagittal · 4.0mm · 0.88mm/px · 17 of 17 slices shown]
[im 1/17]
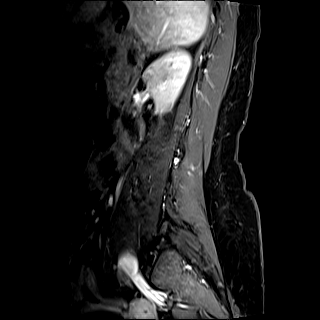
[im 2/17]
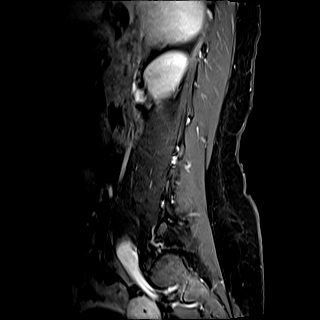
[im 3/17]
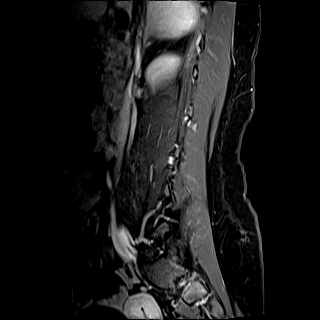
[im 4/17]
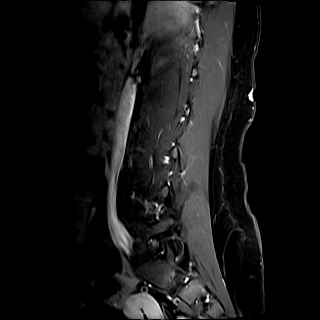
[im 5/17]
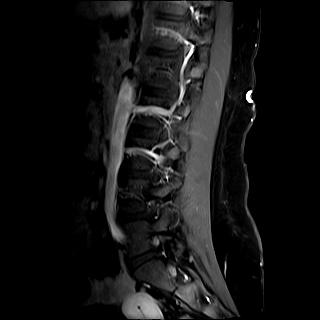
[im 6/17]
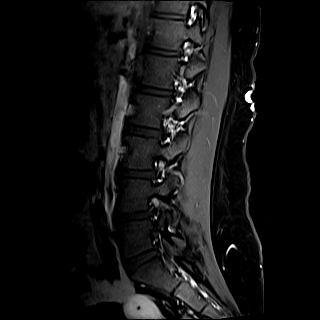
[im 7/17]
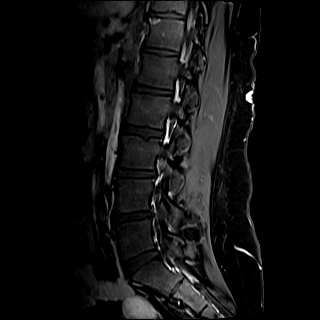
[im 8/17]
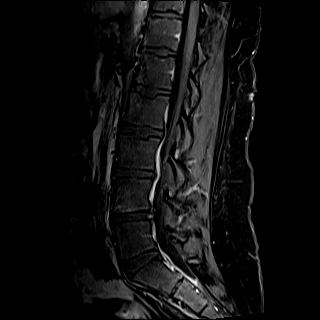
[im 9/17]
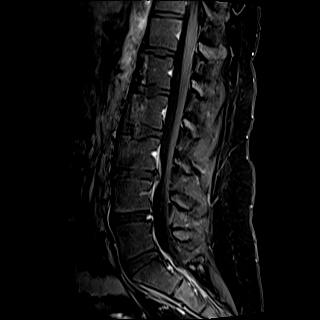
[im 10/17]
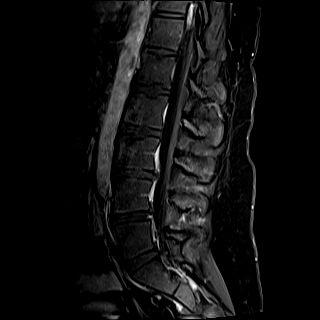
[im 11/17]
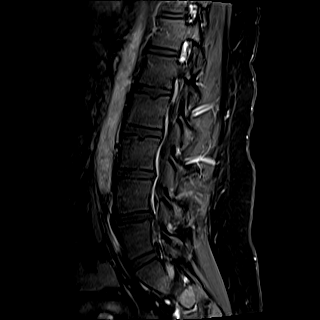
[im 12/17]
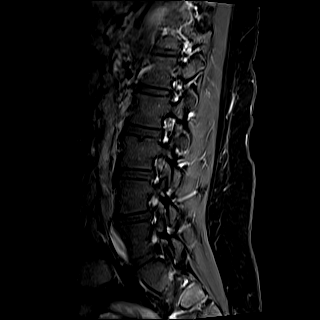
[im 13/17]
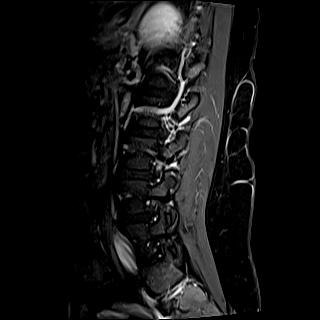
[im 14/17]
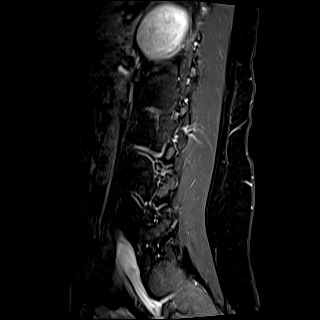
[im 15/17]
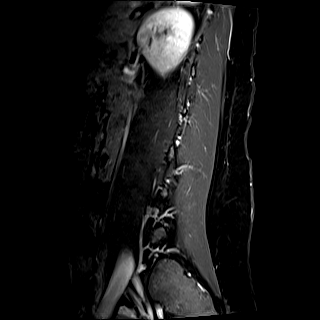
[im 16/17]
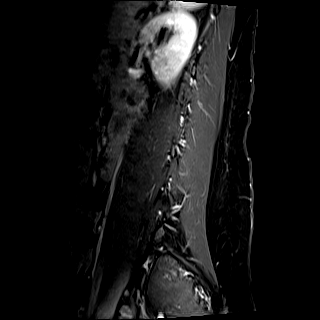
[im 17/17]
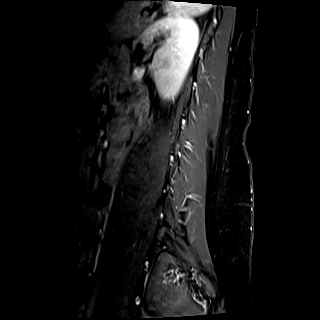

[17 of 17 positions shown; findings below may reference images not displayed]

FINDINGS: MRI THORACIC SPINE FINDINGS

Alignment:  Physiologic.

Vertebrae: No fracture, evidence of discitis, or bone lesion.

Cord:  Normal signal and morphology.

Paraspinal and other soft tissues: Negative.

Disc levels:

No spinal canal or neural foraminal stenosis.

No abnormal contrast enhancement.

MRI LUMBAR SPINE FINDINGS

Segmentation:  Standard.

Alignment:  Physiologic.

Vertebrae:  No fracture, evidence of discitis, or bone lesion.

Conus medullaris: Extends to the L1 level and appears normal.

Paraspinal and other soft tissues: Negative.

Disc levels:

L3-4: Small central disc protrusion without spinal canal or neural
foraminal stenosis.

L4-5: Small central annular fissure. No spinal canal or neural
foraminal stenosis.

The other lumbar disc levels are normal.

No abnormal contrast enhancement.
IMPRESSION: 1. Normal thoracic spine.
2. Mild lower lumbar degenerative disc disease without spinal canal
or neural foraminal stenosis.

## 2021-03-29 IMAGING — MR MR LUMBAR SPINE WO/W CM
4 of 6 series · 32 of 48 positions shown · IV contrast (gadavist)
Comparison: None.

CLINICAL DATA: Mid back pain with difficulty walking

EXAM:
MRI THORACIC AND LUMBAR SPINE WITHOUT AND WITH CONTRAST
TECHNIQUE: Multiplanar and multiecho pulse sequences of the thoracic and lumbar
spine were obtained without and with intravenous contrast.
CONTRAST:  7.5mL GADAVIST GADOBUTROL 1 MMOL/ML IV SOLN

[Series 1: T2 · sagittal · 4.0mm · 0.88mm/px · 5 of 17 slices shown (1 of 2)]
[im 1/17]
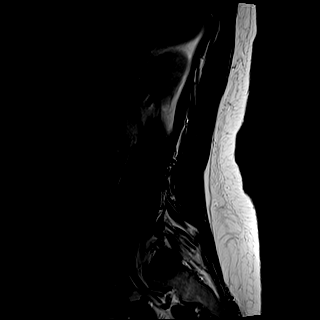
[im 5/17]
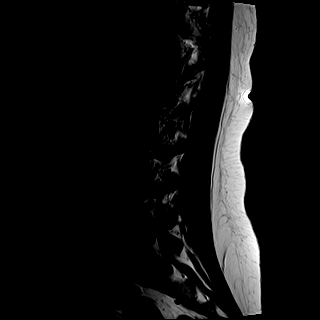
[im 9/17]
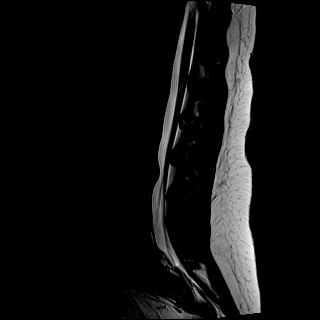
[im 13/17]
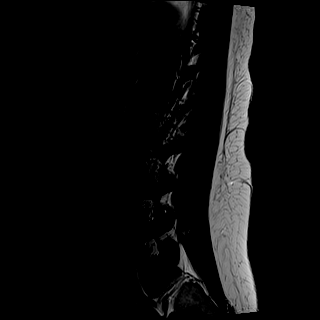
[im 17/17]
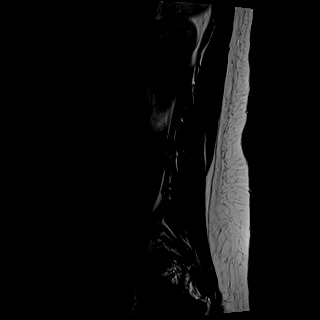

[Series 2: T1 · sagittal · 4.0mm · 0.88mm/px · 5 of 17 slices shown (1 of 2)]
[im 1/17]
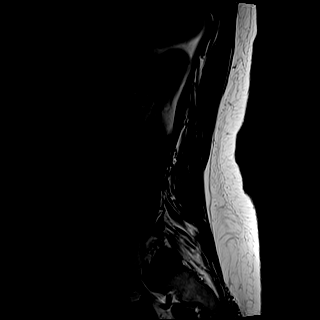
[im 5/17]
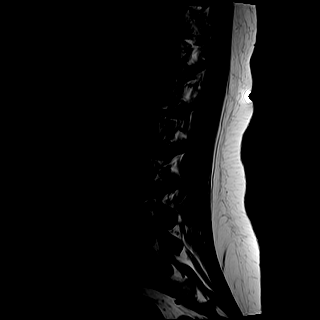
[im 9/17]
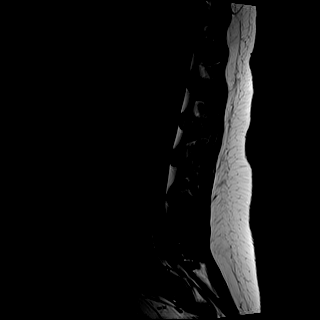
[im 13/17]
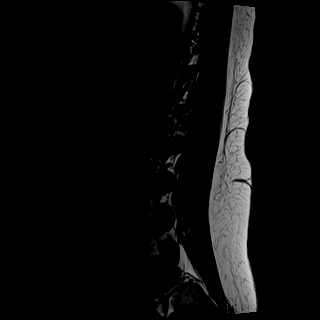
[im 17/17]
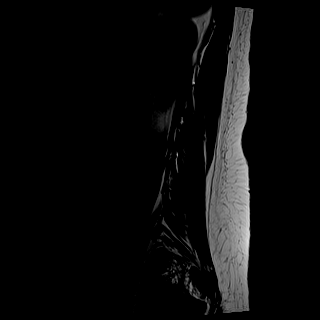

[Series 4: T2 · axial · 4.0mm · 0.78mm/px · z∈[-599,-381]mm · 11 of 38 slices shown (2 of 2)]
[im 1/38]
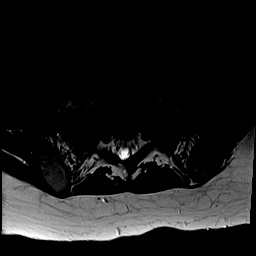
[im 4/38]
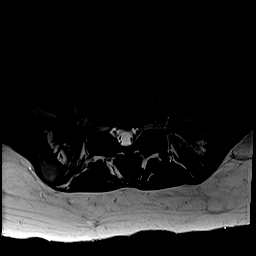
[im 8/38]
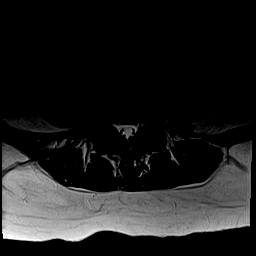
[im 12/38]
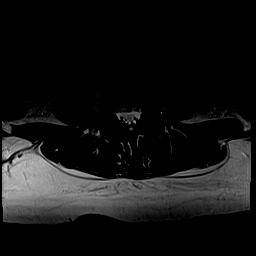
[im 15/38]
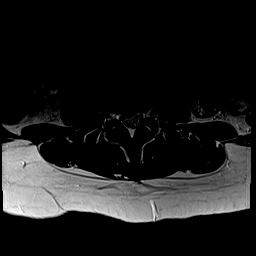
[im 19/38]
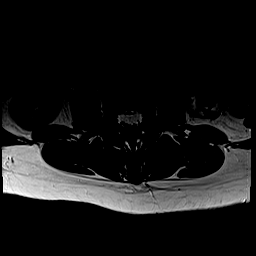
[im 23/38]
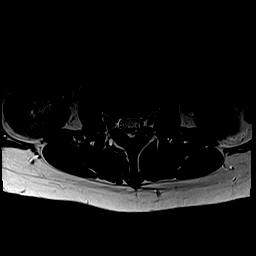
[im 26/38]
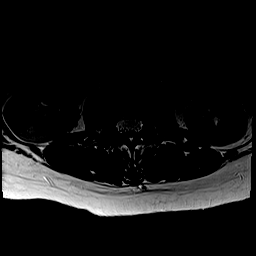
[im 30/38]
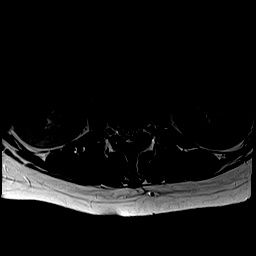
[im 34/38]
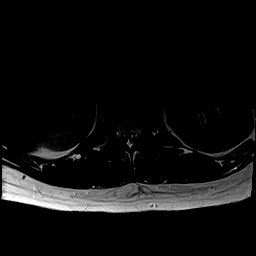
[im 38/38]
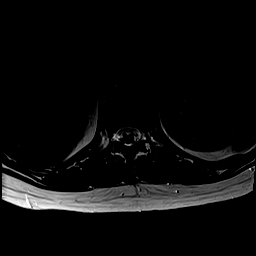

[Series 5: T1 · axial · 4.0mm · 0.39mm/px · z∈[-599,-381]mm · 11 of 38 slices shown (2 of 2)]
[im 1/38]
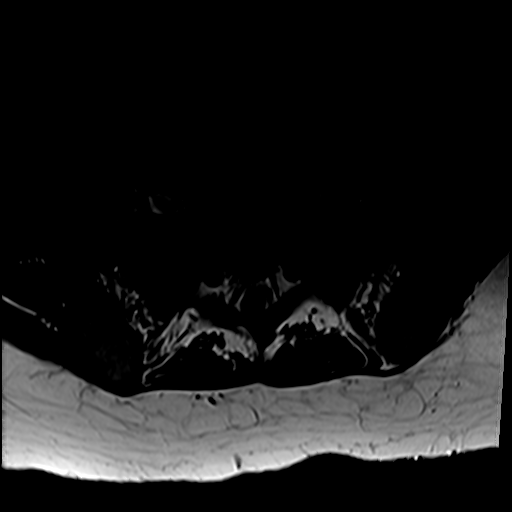
[im 4/38]
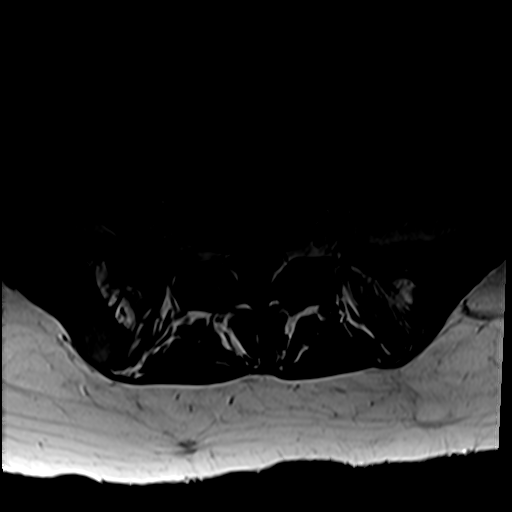
[im 8/38]
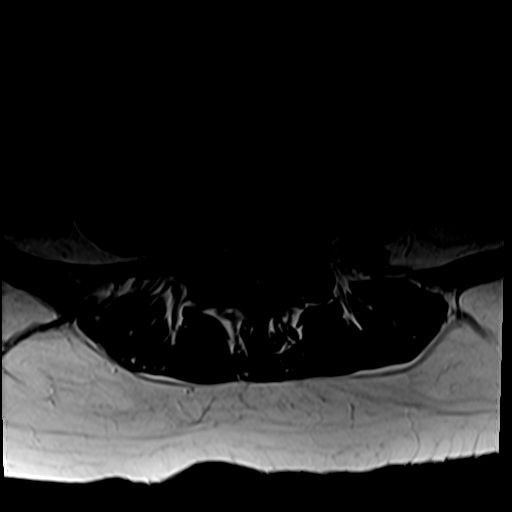
[im 12/38]
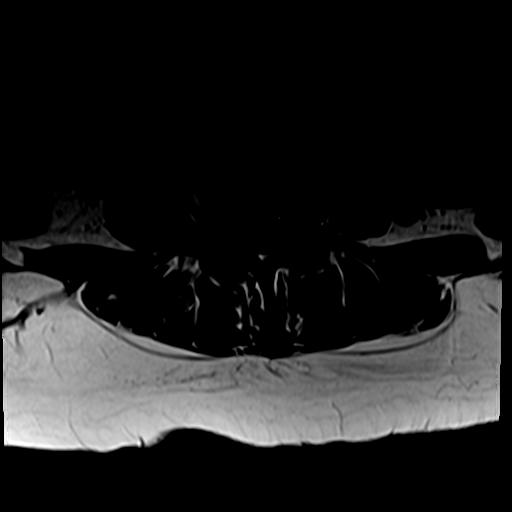
[im 15/38]
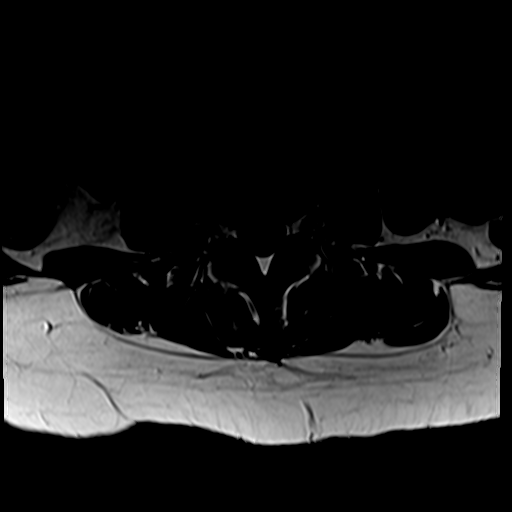
[im 19/38]
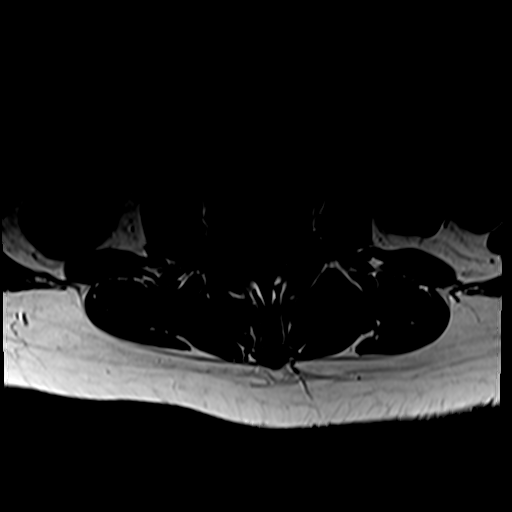
[im 23/38]
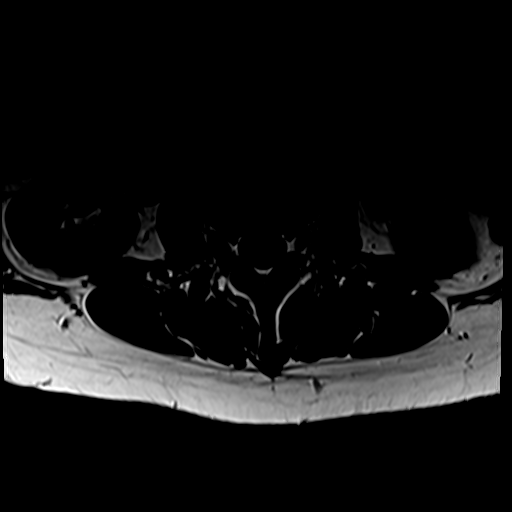
[im 26/38]
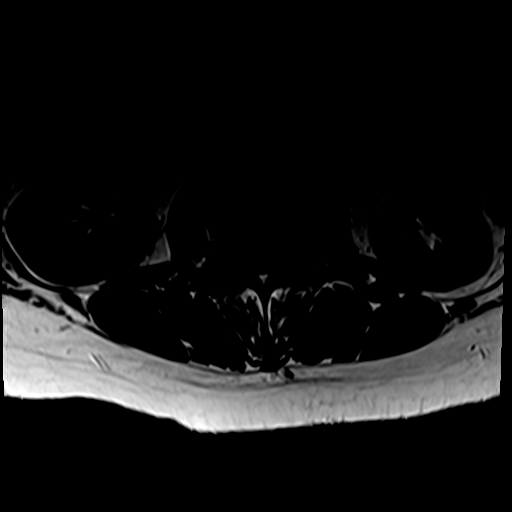
[im 30/38]
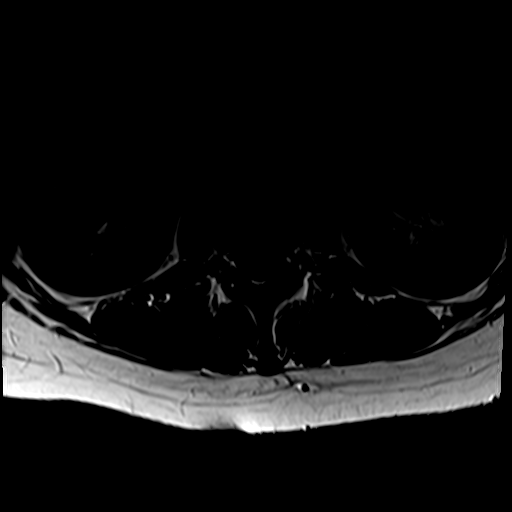
[im 34/38]
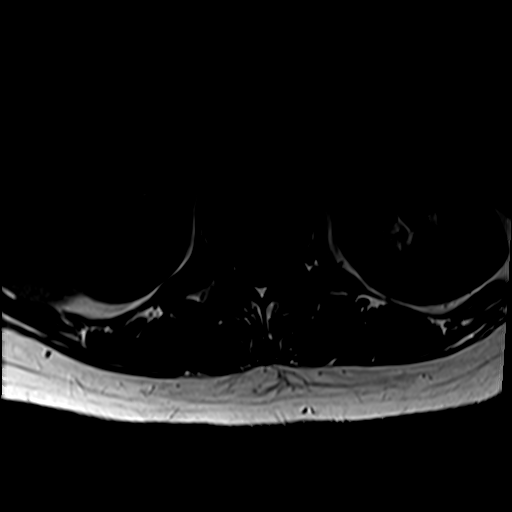
[im 38/38]
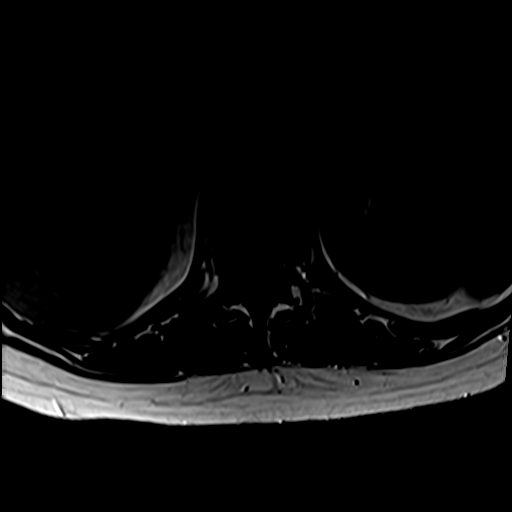

[32 of 48 positions shown; findings below may reference images not displayed]

FINDINGS: MRI THORACIC SPINE FINDINGS

Alignment:  Physiologic.

Vertebrae: No fracture, evidence of discitis, or bone lesion.

Cord:  Normal signal and morphology.

Paraspinal and other soft tissues: Negative.

Disc levels:

No spinal canal or neural foraminal stenosis.

No abnormal contrast enhancement.

MRI LUMBAR SPINE FINDINGS

Segmentation:  Standard.

Alignment:  Physiologic.

Vertebrae:  No fracture, evidence of discitis, or bone lesion.

Conus medullaris: Extends to the L1 level and appears normal.

Paraspinal and other soft tissues: Negative.

Disc levels:

L3-4: Small central disc protrusion without spinal canal or neural
foraminal stenosis.

L4-5: Small central annular fissure. No spinal canal or neural
foraminal stenosis.

The other lumbar disc levels are normal.

No abnormal contrast enhancement.
IMPRESSION: 1. Normal thoracic spine.
2. Mild lower lumbar degenerative disc disease without spinal canal
or neural foraminal stenosis.

## 2021-03-29 IMAGING — CR DG CHEST 1V PORT
1 series · 2 of 2 positions shown · non-contrast
Comparison: None.

CLINICAL DATA: Cough, lower back pain

EXAM:
PORTABLE CHEST 1 VIEW

[Series 1: dg chest port 1 view · 0.14mm/px · 2 of 2 slices shown]
[im 1/2]
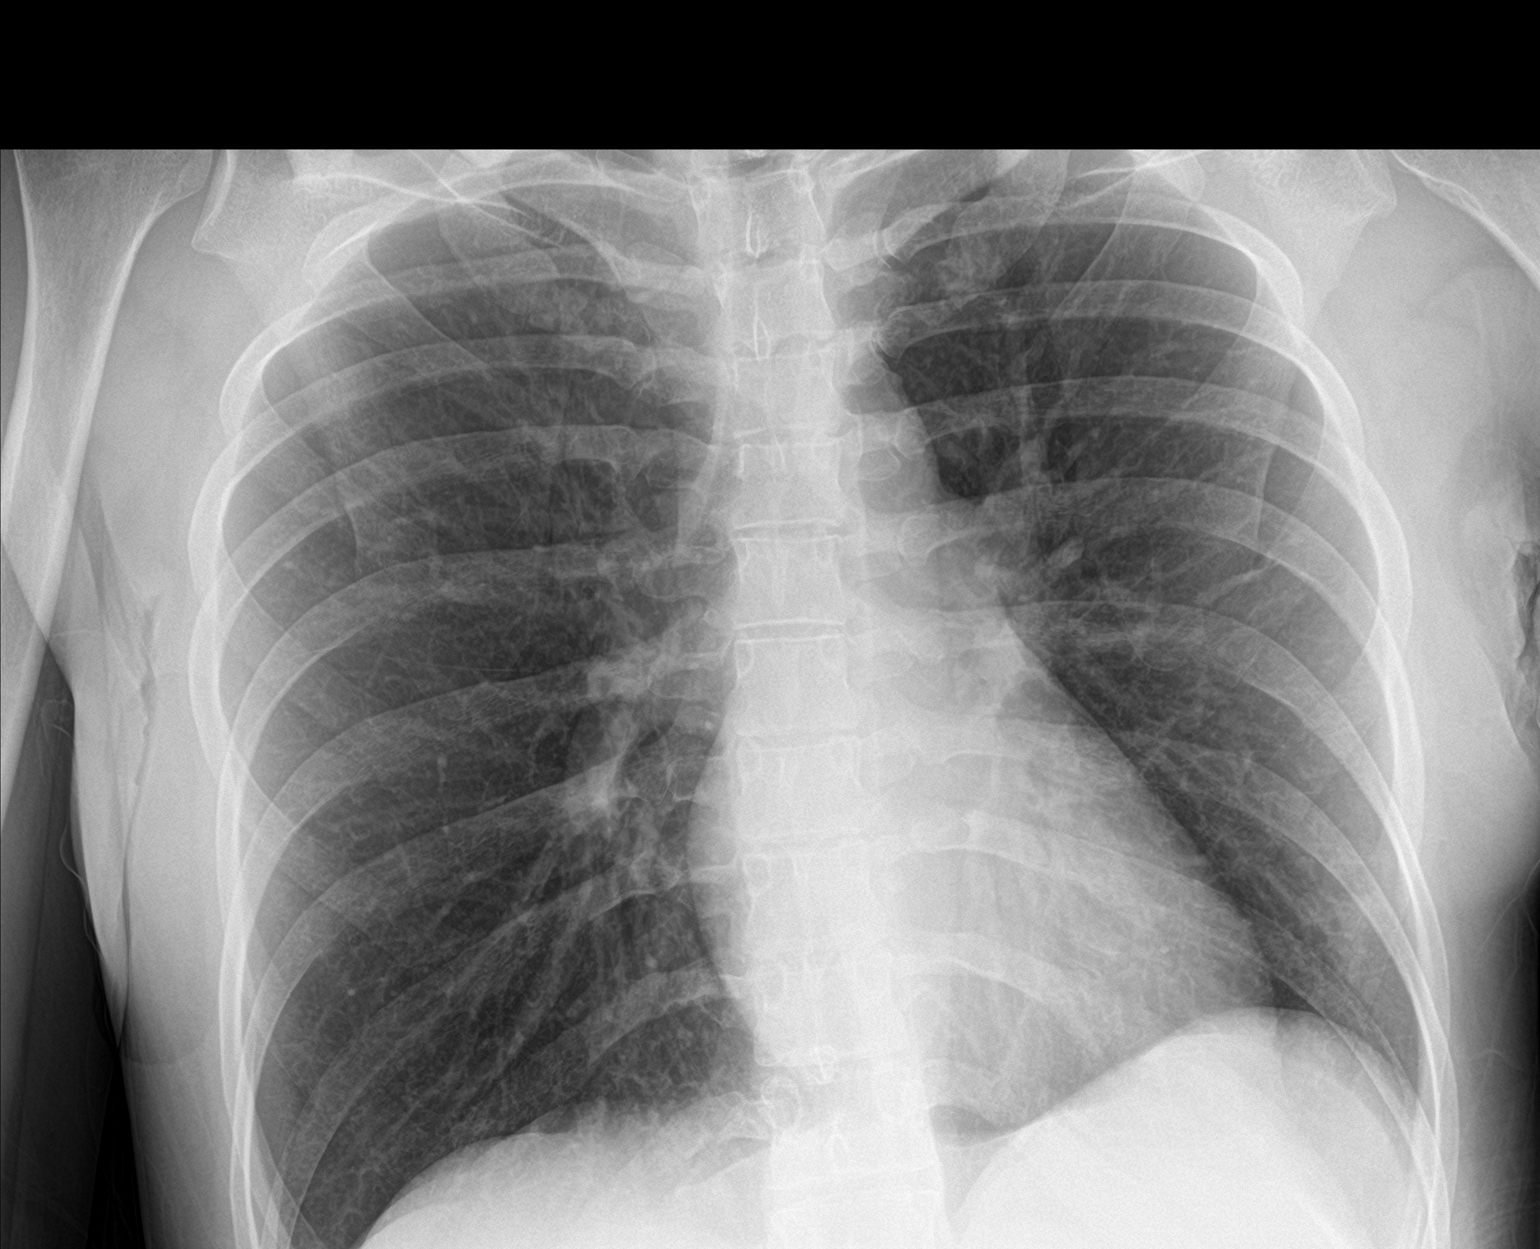
[im 2/2]
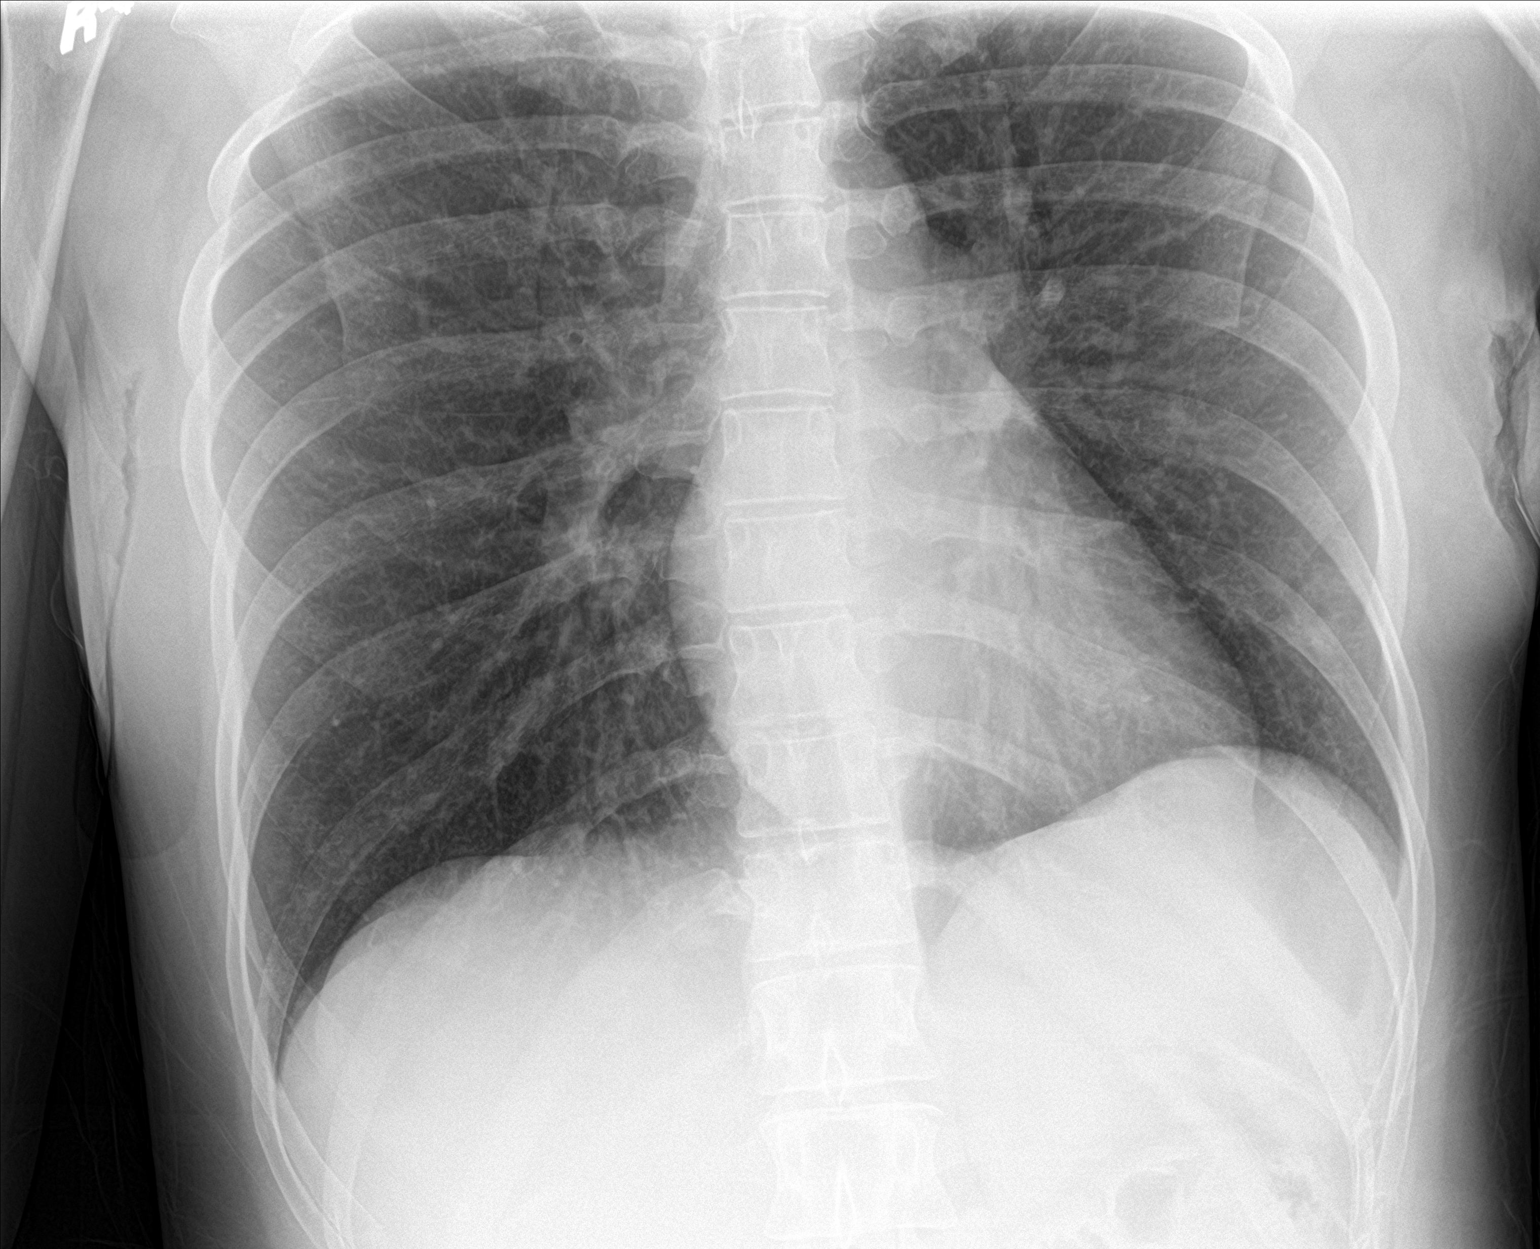

[2 of 2 positions shown; findings below may reference images not displayed]

FINDINGS: 2 frontal views of the chest demonstrate an unremarkable cardiac
silhouette. No airspace disease, effusion, or pneumothorax. There
are no acute displaced fractures.
IMPRESSION: 1. No acute intrathoracic process.

## 2021-03-29 IMAGING — MR MR THORACIC SPINE WO/W CM
7 of 9 series · 32 of 48 positions shown · IV contrast (gadavist)
Comparison: None.

CLINICAL DATA: Mid back pain with difficulty walking

EXAM:
MRI THORACIC AND LUMBAR SPINE WITHOUT AND WITH CONTRAST
TECHNIQUE: Multiplanar and multiecho pulse sequences of the thoracic and lumbar
spine were obtained without and with intravenous contrast.
CONTRAST:  7.5mL GADAVIST GADOBUTROL 1 MMOL/ML IV SOLN

[Series 18: T1 · sagittal · 6.0mm · 1.88mm/px · 1 of 9 slices shown (1 of 2)]
[im 1/9]
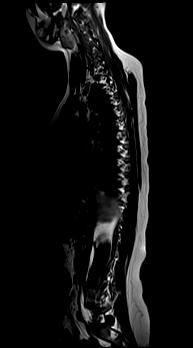

[Series 19: T2 · sagittal · 3.0mm · 1.33mm/px · 3 of 19 slices shown (1 of 2)]
[im 1/19]
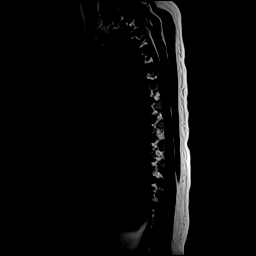
[im 10/19]
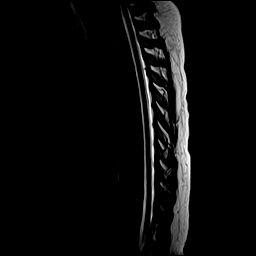
[im 19/19]
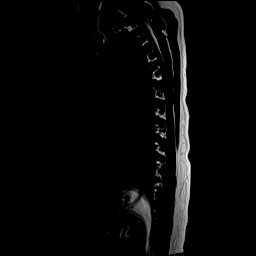

[Series 20: T1 · sagittal · 3.0mm · 1.33mm/px · 4 of 19 slices shown (2 of 2)]
[im 1/19]
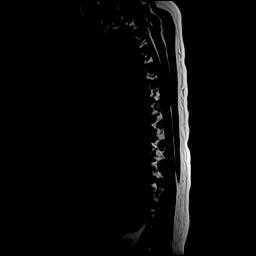
[im 7/19]
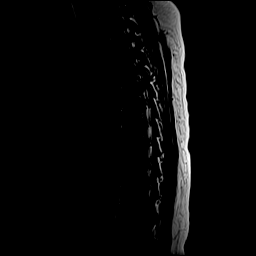
[im 13/19]
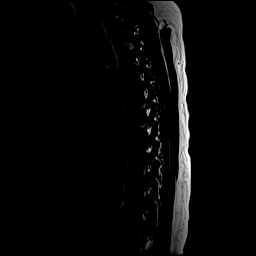
[im 19/19]
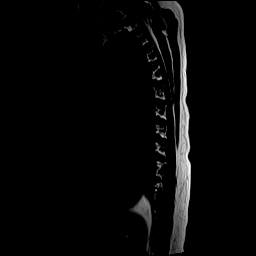

[Series 21: STIR · sagittal · 3.0mm · 0.66mm/px · 4 of 19 slices shown]
[im 1/19]
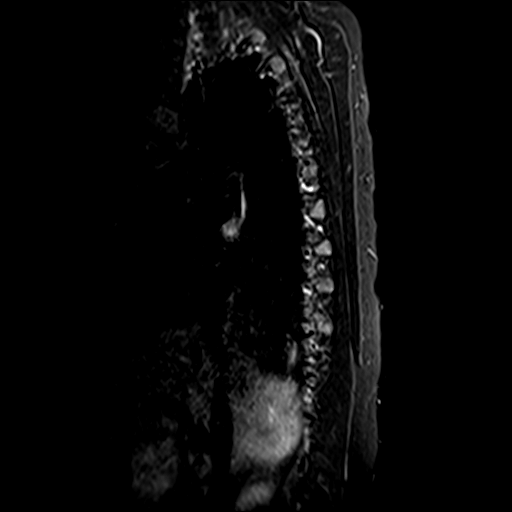
[im 7/19]
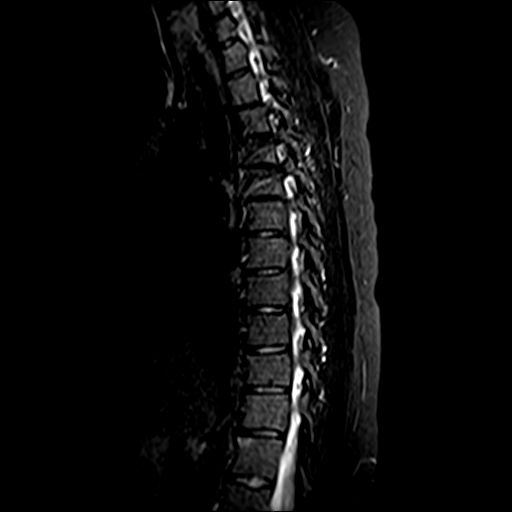
[im 13/19]
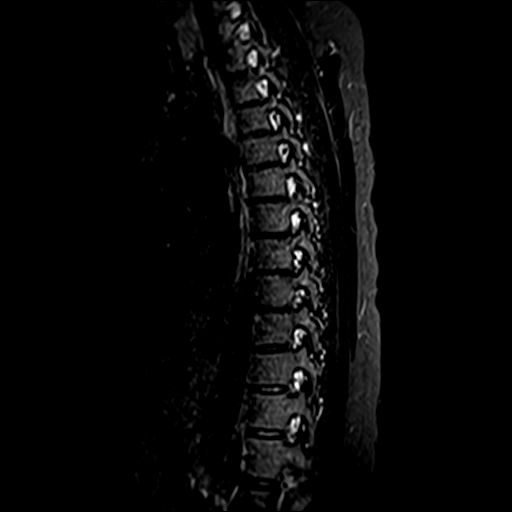
[im 19/19]
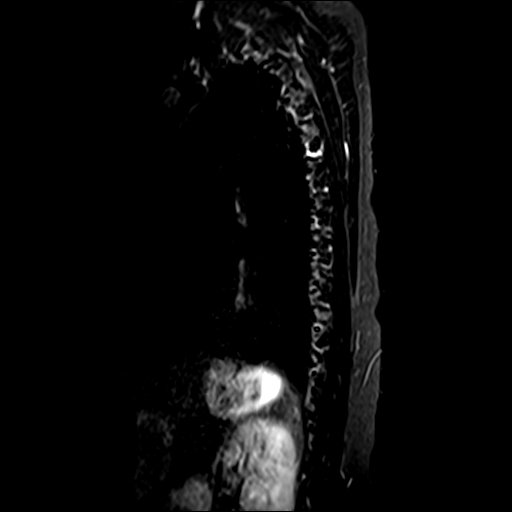

[Series 22: T2 · axial · 4.0mm · 0.59mm/px · z∈[-396,-126]mm · 8 of 39 slices shown (2 of 2)]
[im 1/39]
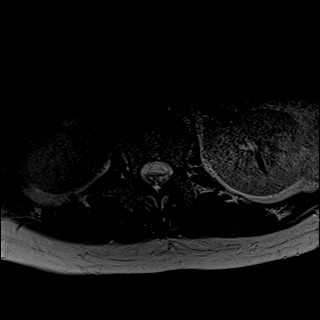
[im 6/39]
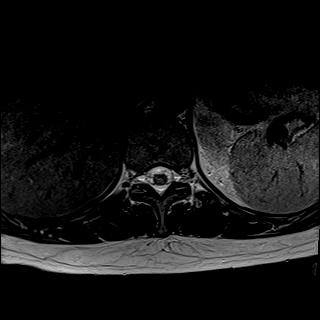
[im 11/39]
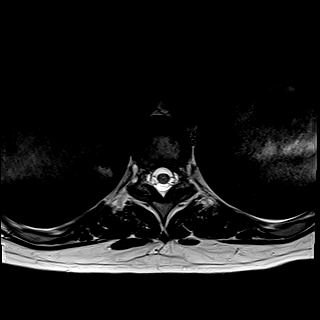
[im 17/39]
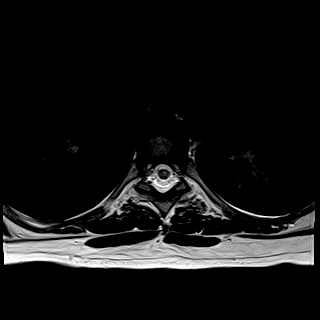
[im 22/39]
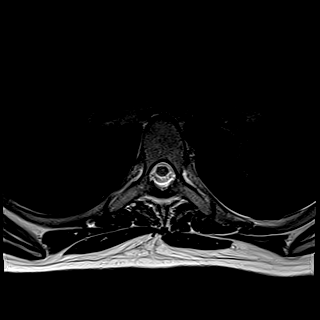
[im 28/39]
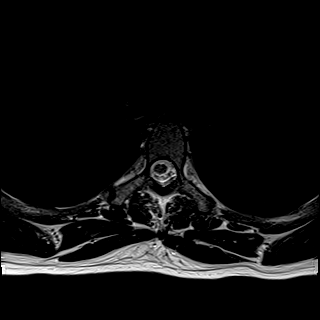
[im 33/39]
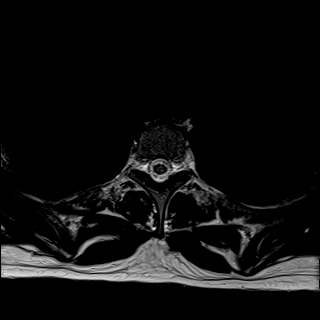
[im 39/39]
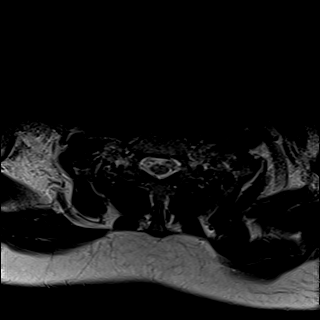

[Series 24: T1 post-contrast · axial · non-contrast · 4.0mm · 0.37mm/px · z∈[-396,-126]mm · 8 of 39 slices shown]
[im 1/39]
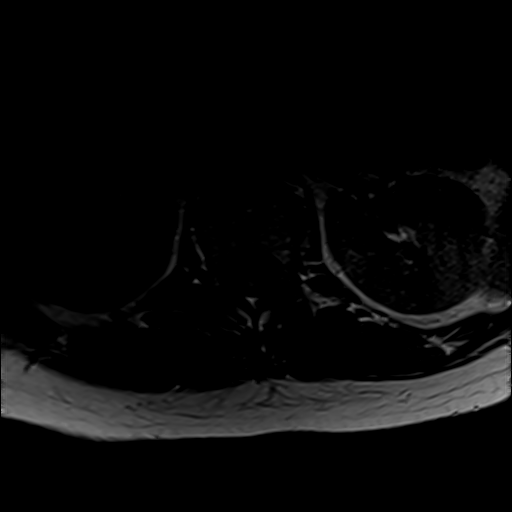
[im 6/39]
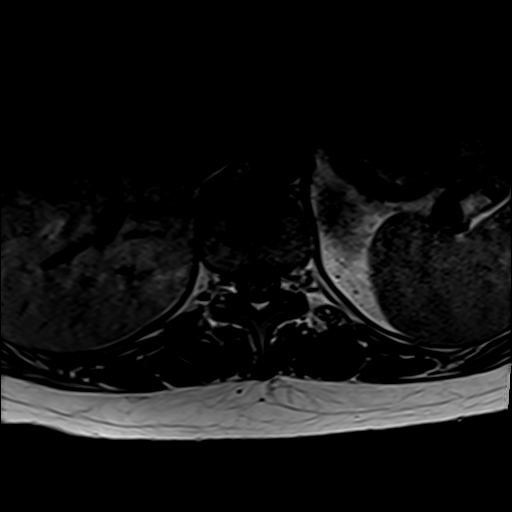
[im 11/39]
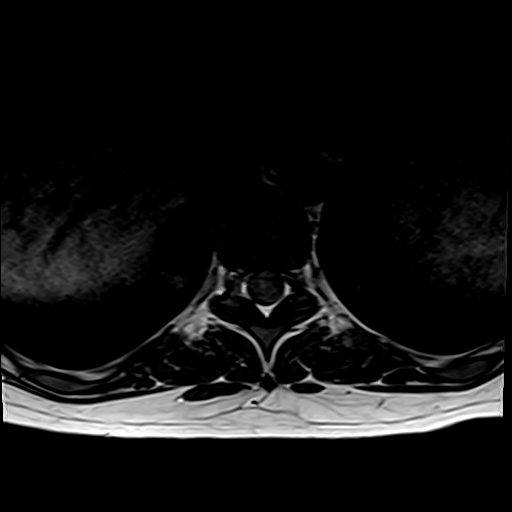
[im 17/39]
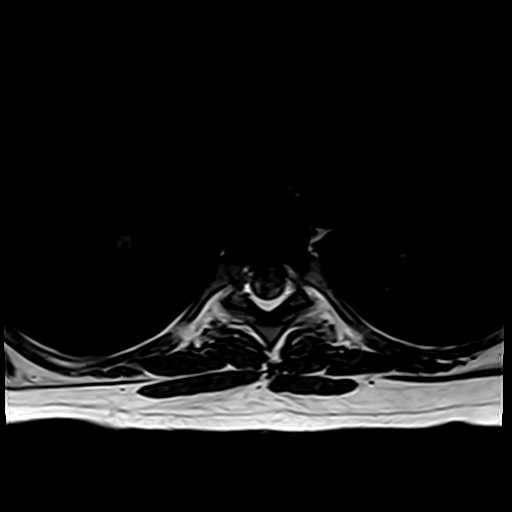
[im 22/39]
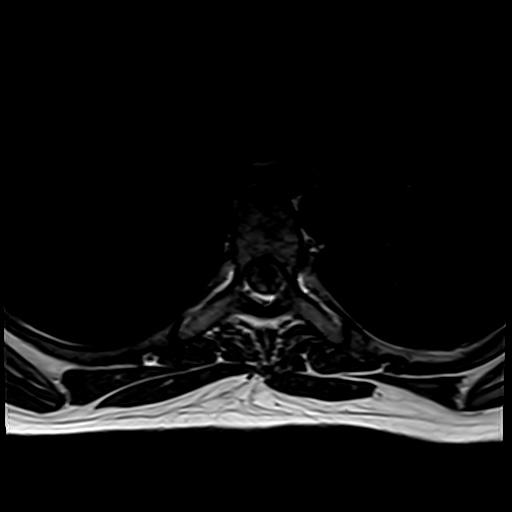
[im 28/39]
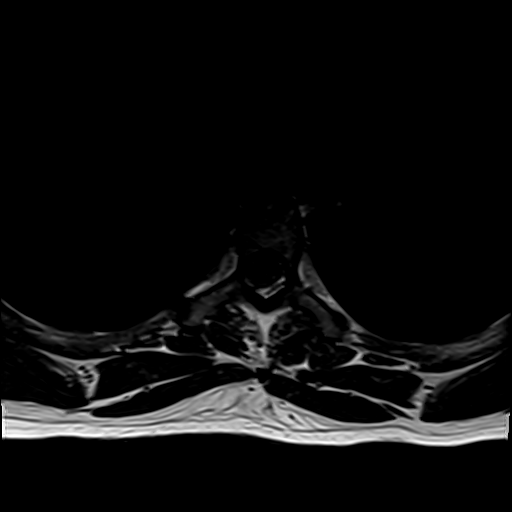
[im 33/39]
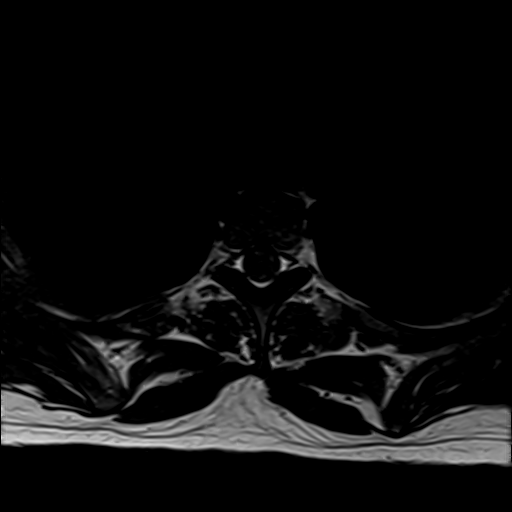
[im 39/39]
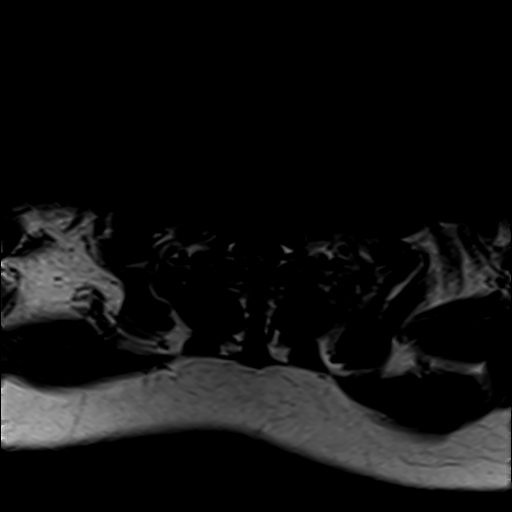

[Series 27: T1 fat-sat post-contrast · sagittal · 3.0mm · 1.33mm/px · 4 of 19 slices shown]
[im 1/19]
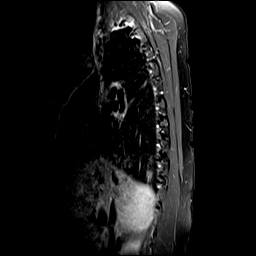
[im 7/19]
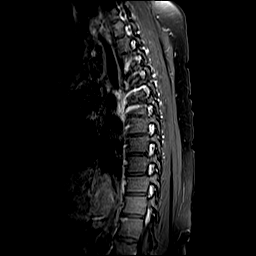
[im 13/19]
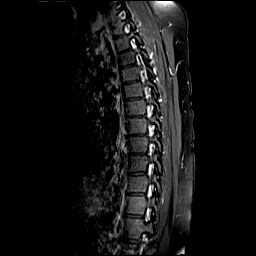
[im 19/19]
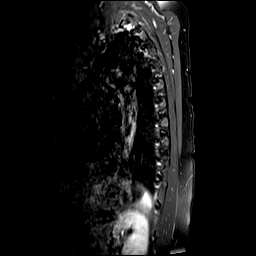

[32 of 48 positions shown; findings below may reference images not displayed]

FINDINGS: MRI THORACIC SPINE FINDINGS

Alignment:  Physiologic.

Vertebrae: No fracture, evidence of discitis, or bone lesion.

Cord:  Normal signal and morphology.

Paraspinal and other soft tissues: Negative.

Disc levels:

No spinal canal or neural foraminal stenosis.

No abnormal contrast enhancement.

MRI LUMBAR SPINE FINDINGS

Segmentation:  Standard.

Alignment:  Physiologic.

Vertebrae:  No fracture, evidence of discitis, or bone lesion.

Conus medullaris: Extends to the L1 level and appears normal.

Paraspinal and other soft tissues: Negative.

Disc levels:

L3-4: Small central disc protrusion without spinal canal or neural
foraminal stenosis.

L4-5: Small central annular fissure. No spinal canal or neural
foraminal stenosis.

The other lumbar disc levels are normal.

No abnormal contrast enhancement.
IMPRESSION: 1. Normal thoracic spine.
2. Mild lower lumbar degenerative disc disease without spinal canal
or neural foraminal stenosis.

## 2021-03-29 MED ORDER — HYDROMORPHONE HCL 1 MG/ML IJ SOLN
0.5000 mg | Freq: Once | INTRAMUSCULAR | Status: AC
  Administered 2021-03-29: 0.5 mg via INTRAVENOUS
  Filled 2021-03-29: qty 1

## 2021-03-29 MED ORDER — MORPHINE SULFATE (PF) 2 MG/ML IV SOLN
2.0000 mg | INTRAVENOUS | Status: DC | PRN
Start: 2021-03-29 — End: 2021-03-30
  Administered 2021-03-29 – 2021-03-30 (×2): 2 mg via INTRAVENOUS
  Filled 2021-03-29 (×2): qty 1

## 2021-03-29 MED ORDER — METHOCARBAMOL 1000 MG/10ML IJ SOLN
1000.0000 mg | Freq: Once | INTRAMUSCULAR | Status: DC
  Filled 2021-03-29: qty 10

## 2021-03-29 MED ORDER — ONDANSETRON HCL 4 MG/2ML IJ SOLN
INTRAMUSCULAR | Status: AC
  Filled 2021-03-29: qty 2

## 2021-03-29 MED ORDER — TRAZODONE HCL 50 MG PO TABS: 25.0000 mg | ORAL_TABLET | Freq: Every evening | ORAL | Status: DC | PRN

## 2021-03-29 MED ORDER — KETOROLAC TROMETHAMINE 15 MG/ML IJ SOLN
15.0000 mg | Freq: Four times a day (QID) | INTRAMUSCULAR | Status: DC | PRN
  Administered 2021-03-29 – 2021-03-30 (×2): 15 mg via INTRAVENOUS
  Filled 2021-03-29 (×3): qty 1

## 2021-03-29 MED ORDER — MAGNESIUM HYDROXIDE 400 MG/5ML PO SUSP: 30.0000 mL | Freq: Every day | ORAL | Status: DC | PRN

## 2021-03-29 MED ORDER — ACETAMINOPHEN 650 MG RE SUPP: 650.0000 mg | Freq: Four times a day (QID) | RECTAL | Status: DC | PRN

## 2021-03-29 MED ORDER — ONDANSETRON HCL 4 MG PO TABS
4.0000 mg | ORAL_TABLET | Freq: Four times a day (QID) | ORAL | Status: DC | PRN
  Administered 2021-03-30: 4 mg via ORAL
  Filled 2021-03-29: qty 1

## 2021-03-29 MED ORDER — KETOROLAC TROMETHAMINE 30 MG/ML IJ SOLN
15.0000 mg | Freq: Once | INTRAMUSCULAR | Status: AC
  Administered 2021-03-29: 15 mg via INTRAVENOUS
  Filled 2021-03-29: qty 1

## 2021-03-29 MED ORDER — BUPROPION HCL ER (XL) 150 MG PO TB24
150.0000 mg | ORAL_TABLET | Freq: Every day | ORAL | Status: DC
  Administered 2021-03-30: 150 mg via ORAL
  Filled 2021-03-29: qty 1

## 2021-03-29 MED ORDER — LACTATED RINGERS IV BOLUS
1000.0000 mL | Freq: Once | INTRAVENOUS | Status: AC
  Administered 2021-03-29: 1000 mL via INTRAVENOUS

## 2021-03-29 MED ORDER — CYCLOBENZAPRINE HCL 10 MG PO TABS
10.0000 mg | ORAL_TABLET | Freq: Once | ORAL | Status: AC
  Administered 2021-03-29: 10 mg via ORAL
  Filled 2021-03-29: qty 1

## 2021-03-29 MED ORDER — ENOXAPARIN SODIUM 40 MG/0.4ML IJ SOSY
40.0000 mg | PREFILLED_SYRINGE | INTRAMUSCULAR | Status: DC
  Administered 2021-03-29: 40 mg via SUBCUTANEOUS
  Filled 2021-03-29: qty 0.4

## 2021-03-29 MED ORDER — LIDOCAINE 5 % EX PTCH
1.0000 | MEDICATED_PATCH | CUTANEOUS | Status: DC
  Administered 2021-03-30: 1 via TRANSDERMAL
  Filled 2021-03-29 (×2): qty 1

## 2021-03-29 MED ORDER — ACETAMINOPHEN 500 MG PO TABS
1000.0000 mg | ORAL_TABLET | Freq: Once | ORAL | Status: AC
  Administered 2021-03-29: 1000 mg via ORAL
  Filled 2021-03-29: qty 2

## 2021-03-29 MED ORDER — ACETAMINOPHEN 325 MG PO TABS
650.0000 mg | ORAL_TABLET | Freq: Four times a day (QID) | ORAL | Status: DC | PRN
  Administered 2021-03-30: 650 mg via ORAL
  Filled 2021-03-29: qty 2

## 2021-03-29 MED ORDER — HYDROMORPHONE HCL 1 MG/ML IJ SOLN
1.0000 mg | Freq: Once | INTRAMUSCULAR | Status: AC
  Administered 2021-03-29: 1 mg via INTRAVENOUS
  Filled 2021-03-29: qty 1

## 2021-03-29 MED ORDER — GADOBUTROL 1 MMOL/ML IV SOLN
7.5000 mL | Freq: Once | INTRAVENOUS | Status: AC | PRN
  Administered 2021-03-29: 7.5 mL via INTRAVENOUS

## 2021-03-29 MED ORDER — MESALAMINE 1.2 G PO TBEC
1.2000 g | DELAYED_RELEASE_TABLET | Freq: Every day | ORAL | Status: DC
  Administered 2021-03-30: 1.2 g via ORAL
  Filled 2021-03-29 (×2): qty 1

## 2021-03-29 MED ORDER — SODIUM CHLORIDE 0.9 % IV SOLN: INTRAVENOUS | Status: DC

## 2021-03-29 MED ORDER — ONDANSETRON HCL 4 MG/2ML IJ SOLN
4.0000 mg | Freq: Once | INTRAMUSCULAR | Status: AC
  Administered 2021-03-29: 4 mg via INTRAVENOUS

## 2021-03-29 MED ORDER — ONDANSETRON HCL 4 MG/2ML IJ SOLN
4.0000 mg | Freq: Four times a day (QID) | INTRAMUSCULAR | Status: DC | PRN
  Administered 2021-03-30: 4 mg via INTRAVENOUS
  Filled 2021-03-29: qty 2

## 2021-03-29 MED ORDER — ONDANSETRON HCL 4 MG/2ML IJ SOLN
4.0000 mg | Freq: Once | INTRAMUSCULAR | Status: AC
  Administered 2021-03-29: 4 mg via INTRAVENOUS
  Filled 2021-03-29: qty 2

## 2021-03-29 NOTE — ED Provider Notes (Signed)
[facility] Emergency Department Provider Note  ____________________________________________   Event Date/Time   First MD Initiated Contact with Patient 03/29/21 1215     (approximate)  I have reviewed the triage vital signs and the nursing notes.   HISTORY  Chief Complaint Back Pain   HPI Frances Walton is a 25 y.o. female with past medical history of IBS and depression who presents for assessment of acute bilateral low back pain she states started suddenly this morning when she was coughing.  Patient states he has had some cough and congestion for about a week but then the lower back pain started this morning during a particularly severe coughing fit.  She denies any significant new headache, earache, sore throat, chest pain, shortness of breath, abdominal pain, pain with urination, diarrhea, constipation, rash or extremity pain.  No other recent falls or injuries.           History reviewed. No pertinent past medical history.  Patient Active Problem List   Diagnosis Date Noted   Intractable low back pain 03/29/2021    History reviewed. No pertinent surgical history.  Prior to Admission medications   Medication Sig Start Date End Date Taking? Authorizing Provider  buPROPion (WELLBUTRIN XL) 150 MG 24 hr tablet Take 150 mg by mouth daily. 03/12/21   [provider]  fluconazole (DIFLUCAN) 150 MG tablet Take 1 tablet (150 mg total) by mouth daily. 03/21/20   Sable Feil, PA-C  mesalamine (LIALDA) 1.2 g EC tablet Take 1.2 g by mouth daily with breakfast.    [provider]  ondansetron (ZOFRAN) 8 MG tablet Take 1 tablet (8 mg total) by mouth every 8 (eight) hours as needed for nausea or vomiting. 03/21/20   Sable Feil, PA-C  oxyCODONE-acetaminophen (PERCOCET) 7.5-325 MG tablet Take 1 tablet by mouth every 6 (six) hours as needed. 03/21/20   Sable Feil, PA-C  phenazopyridine (PYRIDIUM) 200 MG tablet Take 1 tablet (200 mg total)  by mouth 3 (three) times daily as needed for pain. 03/21/20   Sable Feil, PA-C  sulfamethoxazole-trimethoprim (BACTRIM DS) 800-160 MG tablet Take 1 tablet by mouth 2 (two) times daily. 03/21/20   Sable Feil, PA-C    Allergies Latex and Nickel  No family history on file.  Social History    Review of Systems  Review of Systems  Constitutional:  Negative for chills and fever.  HENT:  Positive for congestion. Negative for sore throat.   Eyes:  Negative for pain.  Respiratory:  Positive for cough. Negative for stridor.   Cardiovascular:  Negative for chest pain.  Gastrointestinal:  Negative for vomiting.  Genitourinary:  Negative for dysuria.  Musculoskeletal:  Positive for back pain.  Skin:  Negative for rash.  Neurological:  Negative for seizures, loss of consciousness and headaches.  Psychiatric/Behavioral:  Negative for suicidal ideas.   All other systems reviewed and are negative.    ____________________________________________   PHYSICAL EXAM:  VITAL SIGNS: ED Triage Vitals [03/29/21 1059]  Enc Vitals Group     BP      Pulse      Resp      Temp      Temp src      SpO2      Weight      Height      Head Circumference      Peak Flow      Pain Score 8     Pain Loc  Pain Edu?      Excl. in [place]?    Vitals:   03/29/21 1400 03/29/21 1830  BP: 116/72 106/62  Pulse: 72 65  Resp: 19 17  Temp:    SpO2: 98% 97%   Physical Exam Vitals and nursing note reviewed.  Constitutional:      General: She is not in acute distress.    Appearance: She is well-developed.  HENT:     Head: Normocephalic and atraumatic.     Right Ear: External ear normal.     Left Ear: External ear normal.     Nose: Nose normal.  Eyes:     Conjunctiva/sclera: Conjunctivae normal.  Cardiovascular:     Rate and Rhythm: Normal rate and regular rhythm.     Heart sounds: No murmur heard. Pulmonary:     Effort: Pulmonary effort is normal. No respiratory distress.     Breath  sounds: Normal breath sounds.  Abdominal:     Palpations: Abdomen is soft.     Tenderness: There is no abdominal tenderness.  Musculoskeletal:     Cervical back: Neck supple.  Skin:    General: Skin is warm and dry.     Capillary Refill: Capillary refill takes less than 2 seconds.  Neurological:     Mental Status: She is alert and oriented to person, place, and time.  Psychiatric:        Mood and Affect: Mood normal.    1+ bilateral patellar reflexes.  Patient is able to wiggle her toes in both feet but unable to flex at the hips bilaterally.  Sensation is intact to light touch of both lower extremities.  Downgoing plantar reflexes.  Full strength in the bilateral upper extremities. ____________________________________________   LABS (all labs ordered are listed, but only abnormal results are displayed)  Labs Reviewed  CBC WITH DIFFERENTIAL/PLATELET - Abnormal; Notable for the following components:      Result Value   WBC 16.3 (*)    Neutro Abs 14.2 (*)    All other components within normal limits  COMPREHENSIVE METABOLIC PANEL - Abnormal; Notable for the following components:   Total Bilirubin 1.4 (*)    All other components within normal limits  HCG, QUANTITATIVE, PREGNANCY  URINALYSIS, COMPLETE (UACMP) WITH MICROSCOPIC  HIV ANTIBODY (ROUTINE TESTING W REFLEX)  CBC   ____________________________________________  EKG  ____________________________________________  RADIOLOGY  ED MD interpretation: Chest x-ray has no focal consolidation, effusion, edema, pneumothorax or other clear acute thoracic process.  Plain film of the L-spine shows no acute process.  Official radiology report(s): DG Lumbar Spine Complete  Result Date: 03/29/2021 CLINICAL DATA:  Low back pain, pain after coughing EXAM: LUMBAR SPINE - COMPLETE 4+ VIEW COMPARISON:  None. FINDINGS: Frontal, bilateral oblique, lateral views of the lumbar spine are obtained. There are 5 non-rib-bearing lumbar type  vertebral bodies in grossly normal anatomic alignment. No acute fracture. Disc spaces are well preserved. Sacroiliac joints are normal. IUD is seen within the pelvis. IMPRESSION: 1. Unremarkable lumbar spine. Electronically Signed   By: Randa Ngo M.D.   On: 03/29/2021 16:16   DG Chest Portable 1 View  Result Date: 03/29/2021 CLINICAL DATA:  Cough, lower back pain EXAM: PORTABLE CHEST 1 VIEW COMPARISON:  None. FINDINGS: 2 frontal views of the chest demonstrate an unremarkable cardiac silhouette. No airspace disease, effusion, or pneumothorax. There are no acute displaced fractures. IMPRESSION: 1. No acute intrathoracic process. Electronically Signed   By: Randa Ngo M.D.   On: 03/29/2021 16:17  ____________________________________________   PROCEDURES  Procedure(s) performed (including Critical Care):  Procedures   ____________________________________________   INITIAL IMPRESSION / ASSESSMENT AND PLAN / ED COURSE      Patient presents with above to history exam for assessment approximately week of cough and congestion associate with some low back pain during previous severe coughing fit earlier this morning.  On arrival she is in significant distress crying and refusing to move from chair.  She was given analgesia and eventually able to get into the bed.  She has no significant tenderness throughout her bilateral lower spine without overlying skin changes and no tenderness over the C or T-spine.  Chest x-ray has no focal consolidation, effusion, edema, pneumothorax or other clear acute thoracic process.  Plain film of the L-spine shows no acute process.  CMP has no significant electrolyte or metabolic derangements.  hCG is negative.  CBC with leukocytosis with WBC count of 16.3 without acute anemia and normal platelets.  Differential includes muscle spasm versus strain versus disc herniation in the setting of some trauma from coughing.  I suspect her coughing and congestion is  likely related to a viral URI.  She has no fever or focal consolidation on chest x-ray or abnormal breath sounds to suggest a bacterial pneumonia at this time.  Will defer antibiotics for this.  With regard to low back pain she denies any urinary symptoms or preceding symptoms prior to coughing fit and have a lower suspicion for pyelonephritis, kidney stone, cholecystitis, pancreatitis appendicitis or other acute infectious process in the abdomen.  Patient is unable to flex or extend at the hips or stand ambulate after below noted analgesia.  I will admit for PT OT pain control and MRI of the low spine to assess for possible herniation impinging on sacral nerve.  Patient admitted in stable condition.      ____________________________________________   FINAL CLINICAL IMPRESSION(S) / ED DIAGNOSES  Final diagnoses:  Acute bilateral low back pain with bilateral sciatica  Bronchitis    Medications  lidocaine (LIDODERM) 5 % 1 patch (0 patches Transdermal Patient Refused/Not Given 03/29/21 1715)  buPROPion (WELLBUTRIN XL) 24 hr tablet 150 mg (has no administration in time range)  mesalamine (LIALDA) EC tablet 1.2 g (has no administration in time range)  enoxaparin (LOVENOX) injection 40 mg (has no administration in time range)  0.9 %  sodium chloride infusion (has no administration in time range)  acetaminophen (TYLENOL) tablet 650 mg (has no administration in time range)    Or  acetaminophen (TYLENOL) suppository 650 mg (has no administration in time range)  traZODone (DESYREL) tablet 25 mg (has no administration in time range)  magnesium hydroxide (MILK OF MAGNESIA) suspension 30 mL (has no administration in time range)  ondansetron (ZOFRAN) tablet 4 mg (has no administration in time range)    Or  ondansetron (ZOFRAN) injection 4 mg (has no administration in time range)  HYDROmorphone (DILAUDID) injection 0.5 mg (0.5 mg Intravenous Given 03/29/21 1257)  ondansetron (ZOFRAN) injection 4 mg (4  mg Intravenous Given 03/29/21 1257)  lactated ringers bolus 1,000 mL (0 mLs Intravenous Stopped 03/29/21 1749)  acetaminophen (TYLENOL) tablet 1,000 mg (1,000 mg Oral Given 03/29/21 1334)  cyclobenzaprine (FLEXERIL) tablet 10 mg (10 mg Oral Given 03/29/21 1334)  ketorolac (TORADOL) 30 MG/ML injection 15 mg (15 mg Intravenous Given 03/29/21 1427)  HYDROmorphone (DILAUDID) injection 1 mg (1 mg Intravenous Given 03/29/21 1712)  ondansetron (ZOFRAN) injection 4 mg (4 mg Intravenous Given 03/29/21 1712)  ED Discharge Orders     None        Note:  This document was prepared using Dragon voice recognition software and may include unintentional dictation errors.    Lucrezia Starch, MD 03/29/21 [phone]

## 2021-03-29 NOTE — H&P (Signed)
[place]   PATIENT NAME: Frances Walton    MR#:  [id number]  DATE OF BIRTH:  Sep 20, 1995  DATE OF ADMISSION:  03/29/2021  PRIMARY CARE PHYSICIAN: Lorelee Market, MD   Patient is coming from: Home  REQUESTING/REFERRING PHYSICIAN: Hulan Saas, MD  CHIEF COMPLAINT:   Chief Complaint  Patient presents with   Back Pain    HISTORY OF PRESENT ILLNESS:  Frances Walton is a 25 y.o. Caucasian female with medical history significant for IBS and depression, who presented to the emergency room with acute onset of bilateral low back pain that started suddenly this morning when she was forcefully coughing.  She has been having cough productive of greenish sputum and chest congestion for a week and and this morning her low back pain started in a severe coughing fit.  She denies any paresthesias or focal muscle weakness but did feel her pain went down both legs like sciatica pain she had in the past.  She denies any urinary or stool incontinence or lower extremity paresthesias or weakness.  She denies any chest pain or palpitations.  No cough or wheezing or dyspnea.  No fever or chills.  No dysuria, oliguria or hematuria, urinary frequency, urgency or frequency or flank pain. ED Course: Upon presenting to the emergency room vital signs were within normal.  Labs revealed leukocytosis of 16.3 with neutrophilia.  CMP was remarkable for total bili 1.4 and was otherwise normal.  Serum pregnancy test was negative.  Imaging: Portable chest ray showed no acute cardiopulmonary disease.Lumbar spine x-ray was within normal.  The patient was given 1 g of p.o. Tylenol, 0.5 mg and later 1 mg of IV Dilaudid, 4 mg IV Zofran twice, Toradol 15 mg IV, Flexeril 10 mg p.o. and Lidoderm patch.  She was still in pain during my interview.  LS-spine MRI was pending.  She will be admitted to  an observation medical bed for further evaluation and management. PAST MEDICAL HISTORY:  -Depression - Left-sided  ulcerative colitis with biopsies showing likely secondary to slow resolving infectious colitis. - History of C. difficile diarrhea X3.  Her C. difficile was positive on 01/08/2021 and was treated with Dificid.  PAST SURGICAL HISTORY:  Colonoscopy in 2018 and 2022.  He later revealed colitis.  SOCIAL HISTORY:   Social History   Tobacco Use   Smoking status: Not on file   Smokeless tobacco: Not on file  Substance Use Topics   Alcohol use: Not on file  She is a former smoker.  She drinks alcohol socially.  No history of illicit drug use. FAMILY HISTORY:  Her mother had colon cancer at age of 49.  Her father had colon polyps.  DRUG ALLERGIES:   Allergies  Allergen Reactions   Latex Rash   Nickel Hives    REVIEW OF SYSTEMS:   ROS As per history of present illness. All pertinent systems were reviewed above. Constitutional, HEENT, cardiovascular, respiratory, GI, GU, musculoskeletal, neuro, psychiatric, endocrine, integumentary and hematologic systems were reviewed and are otherwise negative/unremarkable except for positive findings mentioned above in the HPI.   MEDICATIONS AT HOME:   Prior to Admission medications   Medication Sig Start Date End Date Taking? Authorizing Provider  buPROPion (WELLBUTRIN XL) 150 MG 24 hr tablet Take 150 mg by mouth daily. 03/12/21   [provider]  fluconazole (DIFLUCAN) 150 MG tablet Take 1 tablet (150 mg total) by mouth daily. 03/21/20   Sable Feil, PA-C  mesalamine (LIALDA) 1.2 g  EC tablet Take 1.2 g by mouth daily with breakfast.    [provider]  ondansetron (ZOFRAN) 8 MG tablet Take 1 tablet (8 mg total) by mouth every 8 (eight) hours as needed for nausea or vomiting. 03/21/20   Sable Feil, PA-C  oxyCODONE-acetaminophen (PERCOCET) 7.5-325 MG tablet Take 1 tablet by mouth every 6 (six) hours as needed. 03/21/20   Sable Feil, PA-C  phenazopyridine (PYRIDIUM) 200 MG tablet Take 1 tablet (200 mg total) by mouth 3  (three) times daily as needed for pain. 03/21/20   Sable Feil, PA-C  sulfamethoxazole-trimethoprim (BACTRIM DS) 800-160 MG tablet Take 1 tablet by mouth 2 (two) times daily. 03/21/20   Sable Feil, PA-C      VITAL SIGNS:  Blood pressure 106/62, pulse 65, temperature 98.6 F (37 C), temperature source Oral, resp. rate 17, height 5' 11"  (1.803 m), weight 81.6 kg, last menstrual period 03/13/2021, SpO2 97 %.  PHYSICAL EXAMINATION:  Physical Exam  GENERAL:  25 y.o.-year-old Caucasian female patient lying in the bed with no acute distress.  EYES: Pupils equal, round, reactive to light and accommodation. No scleral icterus. Extraocular muscles intact.  HEENT: Head atraumatic, normocephalic. Oropharynx and nasopharynx clear.  NECK:  Supple, no jugular venous distention. No thyroid enlargement, no tenderness.  LUNGS: Normal breath sounds bilaterally, no wheezing, rales,rhonchi or crepitation. No use of accessory muscles of respiration.  CARDIOVASCULAR: Regular rate and rhythm, S1, S2 normal. No murmurs, rubs, or gallops.  ABDOMEN: Soft, nondistended, nontender. Bowel sounds present. No organomegaly or mass.  EXTREMITIES: No pedal edema, cyanosis, or clubbing. Musculoskeletal: Low back tenderness and positive straight leg raising test at 20 degrees on the left and 30 degrees on the right leg. NEUROLOGIC: Cranial nerves II through XII are intact. Muscle strength 5/5 in all extremities. Sensation intact. Gait not checked.  PSYCHIATRIC: The patient is alert and oriented x 3.  Normal affect and good eye contact. SKIN: No obvious rash, lesion, or ulcer.   LABORATORY PANEL:   CBC Recent Labs  Lab 03/29/21 1304  WBC 16.3*  HGB 13.8  HCT 39.7  PLT 319   ------------------------------------------------------------------------------------------------------------------  Chemistries  Recent Labs  Lab 03/29/21 1304  NA 138  K 4.0  CL 103  CO2 22  GLUCOSE 77  BUN 11  CREATININE 0.61   CALCIUM 9.4  AST 23  ALT 15  ALKPHOS 70  BILITOT 1.4*   ------------------------------------------------------------------------------------------------------------------  Cardiac Enzymes No results for input(s): TROPONINI in the last 168 hours. ------------------------------------------------------------------------------------------------------------------  RADIOLOGY:  DG Lumbar Spine Complete  Result Date: 03/29/2021 CLINICAL DATA:  Low back pain, pain after coughing EXAM: LUMBAR SPINE - COMPLETE 4+ VIEW COMPARISON:  None. FINDINGS: Frontal, bilateral oblique, lateral views of the lumbar spine are obtained. There are 5 non-rib-bearing lumbar type vertebral bodies in grossly normal anatomic alignment. No acute fracture. Disc spaces are well preserved. Sacroiliac joints are normal. IUD is seen within the pelvis. IMPRESSION: 1. Unremarkable lumbar spine. Electronically Signed   By: Randa Ngo M.D.   On: 03/29/2021 16:16   DG Chest Portable 1 View  Result Date: 03/29/2021 CLINICAL DATA:  Cough, lower back pain EXAM: PORTABLE CHEST 1 VIEW COMPARISON:  None. FINDINGS: 2 frontal views of the chest demonstrate an unremarkable cardiac silhouette. No airspace disease, effusion, or pneumothorax. There are no acute displaced fractures. IMPRESSION: 1. No acute intrathoracic process. Electronically Signed   By: Randa Ngo M.D.   On: 03/29/2021 16:17      IMPRESSION  AND PLAN:  Active Problems:   Intractable low back pain  1.  Intractable low back pain status post severe cough. - The patient will be admitted to an observation medical bed. - Pain management will be provided. - LS-spine MRI is currently pending to assess for suspicious disc prolapse/herniation or ligamentous injury.. - May need neurosurgery consult depending  2.  Acute bronchitis/lower respiratory infection. - The patient will be placed on mucolytic therapy as well as antitussives. - We will add bronchodilator  therapy. - We will add antibiotic therapy with IV Rocephin and Zithromax.  3.  Depression. - We will continue Wellbutrin XL.  4.  Left-sided ulcerative colitis. - We will continue her mesalamine.  DVT prophylaxis: Lovenox. Code Status: full code. Family Communication:  The plan of care was discussed in details with the patient (and family). I answered all questions. The patient agreed to proceed with the above mentioned plan. Further management will depend upon hospital course. Disposition Plan: Back to previous home environment Consults called: none. All the records are reviewed and case discussed with ED provider.  Status is: Observation  The patient remains OBS appropriate and will d/c before 2 midnights.  Dispo: The patient is from: Home              Anticipated d/c is to: Home              Patient currently is not medically stable to d/c.   Difficult to place patient No   TOTAL TIME TAKING CARE OF THIS PATIENT: 55 minutes.    Christel Mormon M.D on 03/29/2021 at 7:34 PM  [facility]   From 7 PM-7 AM, contact night-coverage [email or web address]  CC: Primary care physician; Lorelee Market, MD

## 2021-03-29 NOTE — ED Triage Notes (Signed)
Pt comes with c/o back pain. Pt states chronic issue and it is lumbar pain. Pt denies any injuries. Pt states 8/10 pain

## 2021-03-29 NOTE — ED Notes (Signed)
Messaged attending provider, Dr Sidney Ace, per pt request - pt requests pain medication at this time.

## 2021-03-30 ENCOUNTER — Encounter: Payer: Self-pay | Admitting: Family Medicine

## 2021-03-30 ENCOUNTER — Other Ambulatory Visit: Payer: Self-pay | Admitting: Internal Medicine

## 2021-03-30 DIAGNOSIS — J40 Bronchitis, not specified as acute or chronic: Secondary | ICD-10-CM

## 2021-03-30 DIAGNOSIS — M5442 Lumbago with sciatica, left side: Secondary | ICD-10-CM | POA: Diagnosis not present

## 2021-03-30 DIAGNOSIS — M5441 Lumbago with sciatica, right side: Secondary | ICD-10-CM

## 2021-03-30 DIAGNOSIS — M5459 Other low back pain: Secondary | ICD-10-CM | POA: Diagnosis not present

## 2021-03-30 LAB — CBC
HCT: 37.1 % (ref 36.0–46.0)
Hemoglobin: 12.1 g/dL (ref 12.0–15.0)
MCH: 30.6 pg (ref 26.0–34.0)
MCHC: 32.6 g/dL (ref 30.0–36.0)
MCV: 93.7 fL (ref 80.0–100.0)
Platelets: 273 10*3/uL (ref 150–400)
RBC: 3.96 MIL/uL (ref 3.87–5.11)
RDW: 13.2 % (ref 11.5–15.5)
WBC: 10.9 10*3/uL — ABNORMAL HIGH (ref 4.0–10.5)
nRBC: 0 % (ref 0.0–0.2)

## 2021-03-30 LAB — HIV ANTIBODY (ROUTINE TESTING W REFLEX): HIV Screen 4th Generation wRfx: NONREACTIVE

## 2021-03-30 MED ORDER — HYDROCODONE BIT-HOMATROP MBR 5-1.5 MG/5ML PO SOLN: 5.0000 mL | Freq: Four times a day (QID) | ORAL | 0 refills | Status: DC | PRN

## 2021-03-30 MED ORDER — CYCLOBENZAPRINE HCL 10 MG PO TABS
10.0000 mg | ORAL_TABLET | Freq: Three times a day (TID) | ORAL | Status: DC | PRN
  Administered 2021-03-30 (×2): 10 mg via ORAL
  Filled 2021-03-30 (×2): qty 1

## 2021-03-30 MED ORDER — TRAMADOL HCL 50 MG PO TABS: 50.0000 mg | ORAL_TABLET | Freq: Four times a day (QID) | ORAL | 0 refills | Status: DC | PRN

## 2021-03-30 MED ORDER — INFLUENZA VAC SPLIT QUAD 0.5 ML IM SUSY: 0.5000 mL | PREFILLED_SYRINGE | INTRAMUSCULAR | Status: DC

## 2021-03-30 MED ORDER — HYDROMORPHONE HCL 1 MG/ML IJ SOLN
1.0000 mg | INTRAMUSCULAR | Status: DC | PRN
  Administered 2021-03-30 (×2): 1 mg via INTRAVENOUS
  Filled 2021-03-30 (×2): qty 1

## 2021-03-30 MED ORDER — OXYCODONE-ACETAMINOPHEN 5-325 MG PO TABS
1.0000 | ORAL_TABLET | ORAL | Status: AC
  Administered 2021-03-30: 1 via ORAL
  Filled 2021-03-30: qty 1

## 2021-03-30 MED ORDER — ACETAMINOPHEN 325 MG PO TABS: 650.0000 mg | ORAL_TABLET | Freq: Four times a day (QID) | ORAL | 0 refills | Status: AC | PRN

## 2021-03-30 MED ORDER — GUAIFENESIN-DM 100-10 MG/5ML PO SYRP: 5.0000 mL | ORAL_SOLUTION | ORAL | 0 refills | Status: DC | PRN

## 2021-03-30 MED ORDER — GUAIFENESIN-DM 100-10 MG/5ML PO SYRP
5.0000 mL | ORAL_SOLUTION | ORAL | Status: DC | PRN
  Administered 2021-03-30: 5 mL via ORAL
  Filled 2021-03-30: qty 5

## 2021-03-30 MED ORDER — CYCLOBENZAPRINE HCL 10 MG PO TABS: 10.0000 mg | ORAL_TABLET | Freq: Three times a day (TID) | ORAL | 0 refills | Status: DC | PRN

## 2021-03-30 NOTE — Consult Note (Signed)
Neurosurgery-New Consultation Evaluation 03/30/2021 Annye Forrey [id number]  Identifying Statement: Frances Walton is a 25 y.o. female from [place] [address] with back pain  Physician Requesting Consultation: Dr Manuella Ghazi  History of Present Illness: Frances Walton is here for evaluation of acute low back pain that she says happened after a coughing fit.  She reports a history of chronic back problems.  She does have some pain over going to the upper leg and hip area but she denies any radiating pain down her legs.  She does have some right ankle problems due to her prior injury.  She does not report any new numbness or weakness.  The pain was so severe that it limited her standing and walking and therefore she presented to the emergency department.  She has been given medication here and has a physical therapy order in place.  As part of her work-up, she did undergo an MRI of the lumbar and thoracic spine.  Neurosurgery was consulted given her pain.  Past Medical History:  History reviewed. No pertinent past medical history.  Social History: Social History   Socioeconomic History   Marital status: Divorced    Spouse name: Not on file   Number of children: Not on file   Years of education: Not on file   Highest education level: Not on file  Occupational History   Not on file  Tobacco Use   Smoking status: Not on file   Smokeless tobacco: Not on file  Vaping Use   Vaping Use: Unknown  Substance and Sexual Activity   Alcohol use: Not on file   Drug use: Not on file   Sexual activity: Not on file  Other Topics Concern   Not on file  Social History Narrative   Not on file   Social Determinants of Health   Financial Resource Strain: Not on file  Food Insecurity: Not on file  Transportation Needs: Not on file  Physical Activity: Not on file  Stress: Not on file  Social Connections: Not on file  Intimate Partner Violence: Not on file    Family History: History reviewed. No  pertinent family history.  Review of Systems:  Review of Systems - General ROS: Negative Psychological ROS: Negative Ophthalmic ROS: Negative ENT ROS: Negative Hematological and Lymphatic ROS: Negative  Endocrine ROS: Negative Respiratory ROS: Negative Cardiovascular ROS: Negative Gastrointestinal ROS: Negative Genito-Urinary ROS: Negative Musculoskeletal ROS: Positive for back pain Neurological ROS: Negative for numbness or weakness Dermatological ROS: Negative  Physical Exam: BP 106/67 (BP Location: Left Arm)   Pulse 65   Temp 97.9 F (36.6 C)   Resp 15   Ht 5' 11"  (1.803 m)   Wt 81.6 kg   LMP 03/13/2021 (Approximate)   SpO2 100%   BMI 25.09 kg/m  Body mass index is 25.09 kg/m. Body surface area is 2.02 meters squared. General appearance: Alert, cooperative, appears uncomfortable Head: Normocephalic, atraumatic Eyes: Normal, EOM intact Oropharynx: Moist without lesions Ext: No edema in LE bilaterally  Neurologic exam:  Mental status: alertness: alert, affect: normal Speech: fluent and clear Motor:strength symmetric 5/5 in bilateral lower extremities in all motor groups including hip flexion, knee extension, dorsiflex, plantarflexion Sensory: intact to light touch in bilateral lower extremities Gait: Not tested  Laboratory: Results for orders placed or performed during the hospital encounter of 03/29/21  CBC with Differential  Result Value Ref Range   WBC 16.3 (H) 4.0 - 10.5 K/uL   RBC 4.27 3.87 - 5.11 MIL/uL   Hemoglobin 13.8  12.0 - 15.0 g/dL   HCT 39.7 36.0 - 46.0 %   MCV 93.0 80.0 - 100.0 fL   MCH 32.3 26.0 - 34.0 pg   MCHC 34.8 30.0 - 36.0 g/dL   RDW 13.2 11.5 - 15.5 %   Platelets 319 150 - 400 K/uL   nRBC 0.0 0.0 - 0.2 %   Neutrophils Relative % 88 %   Neutro Abs 14.2 (H) 1.7 - 7.7 K/uL   Lymphocytes Relative 7 %   Lymphs Abs 1.1 0.7 - 4.0 K/uL   Monocytes Relative 5 %   Monocytes Absolute 0.9 0.1 - 1.0 K/uL   Eosinophils Relative 0 %    Eosinophils Absolute 0.0 0.0 - 0.5 K/uL   Basophils Relative 0 %   Basophils Absolute 0.0 0.0 - 0.1 K/uL   Immature Granulocytes 0 %   Abs Immature Granulocytes 0.07 0.00 - 0.07 K/uL  Comprehensive metabolic panel  Result Value Ref Range   Sodium 138 135 - 145 mmol/L   Potassium 4.0 3.5 - 5.1 mmol/L   Chloride 103 98 - 111 mmol/L   CO2 22 22 - 32 mmol/L   Glucose, Bld 77 70 - 99 mg/dL   BUN 11 6 - 20 mg/dL   Creatinine, Ser 0.61 0.44 - 1.00 mg/dL   Calcium 9.4 8.9 - 10.3 mg/dL   Total Protein 7.2 6.5 - 8.1 g/dL   Albumin 3.9 3.5 - 5.0 g/dL   AST 23 15 - 41 U/L   ALT 15 0 - 44 U/L   Alkaline Phosphatase 70 38 - 126 U/L   Total Bilirubin 1.4 (H) 0.3 - 1.2 mg/dL   GFR, Estimated >60 >60 mL/min   Anion gap 13 5 - 15  hCG, quantitative, pregnancy  Result Value Ref Range   hCG, Beta Chain, Quant, S <1 <5 mIU/mL  CBC  Result Value Ref Range   WBC 10.9 (H) 4.0 - 10.5 K/uL   RBC 3.96 3.87 - 5.11 MIL/uL   Hemoglobin 12.1 12.0 - 15.0 g/dL   HCT 37.1 36.0 - 46.0 %   MCV 93.7 80.0 - 100.0 fL   MCH 30.6 26.0 - 34.0 pg   MCHC 32.6 30.0 - 36.0 g/dL   RDW 13.2 11.5 - 15.5 %   Platelets 273 150 - 400 K/uL   nRBC 0.0 0.0 - 0.2 %   I personally reviewed labs  Imaging: MRI lumbar spine: There is a normal lordotic curvature.  There is a normal alignment.  There is minimal degenerative disease.  There is a disc of fusion at L3-4 which is central without any foraminal or central stenosis.  There were no other levels of significant stenosis noted   Impression/Plan:  Frances Walton is here with what appears to be predominately musculoskeletal back pain.  I do not see any obvious concerns in the MRI for surgical pathology.  She does not have any neurologic symptoms and therefore I do recommend a more medical approach to her back pain.  She may benefit from a physiatry referral or the pain clinic if unable to be handled by PCP.  She would benefit from physical therapy evaluation here.  A back brace such  as a LSO might also be beneficial.  Oral medications and topical treatments should be considered   1.  Diagnosis: Musculoskeletal back pain  2.  Plan -Recommend follow-up with PCP or referral to physiatry for treatment.  No neurosurgical follow-up is needed

## 2021-03-30 NOTE — Progress Notes (Signed)
Met with the patient to discuss DC plan and needs She will be getting a 3 in 1 from [facility], delivered to the room, She has Medicaid and they have assigned a doctor to her, She has transportation to the doctor and can afford her medication She complains of severe pain and a cough and asked for cough medication and a lidocain patch, I notified the Physician and he will go see her to address, she stated that she has no additional needs

## 2021-03-30 NOTE — Progress Notes (Signed)
Patient discharged home via personal vehicle. IV removed. All belongings sent with patient. Prescription sent to [facility] on [address]. Zofran called in for patient. 3-in-1 and back brace with patient. Discharge went over with opportunity to ask questions.

## 2021-03-30 NOTE — Evaluation (Signed)
Occupational Therapy Evaluation Patient Details Name: Frances Walton MRN: [id number] DOB: [birth year] Today's Date: 03/30/2021   History of Present Illness Frances Walton is a 25 y.o. Caucasian female with medical history significant for IBS and depression, who presented to the emergency room with acute onset of bilateral low back pain that started suddenly this morning when she was forcefully coughing.   Clinical Impression   [name] was seen for OT evaluation this date. Prior to hospital admission, pt was Independent for mobility and I/ADLs including working as Educational psychologist. Pt lives with spouse and 50yo child in home c level entry (spouse works 2pm-midnight). Pt presents to acute OT demonstrating impaired ADL performance and functional mobility 2/2 decreased activity tolerance, decreased LB access, and pain.   Upon arrival pt reports 3/10 pain at rest, increasing to 10/10 with mobility and resolving with return to supine. Pt requires initial MOD A log rolling>sitting, improved to SUPERVISISON + VCs on second trial. OT requested to return to room to assist with BSC t/f at pt request. MIN A + HHA for BSC t/f, SETUP pericare c lateral leans. MAX A underwear mgmt in standing for periaccess. SETUP seated grooming tasks, unable to tolerate standing grooming task 2/2 pain.   Pt instructed in pain mgmt techniques, functional application of back precautions, and adapted dressing techniques. RN notified pt 9/10 pain at rest end of session, ice provided. Pt would benefit from skilled OT to address carryover of adapted dressing and bathing techniques. Upon hospital discharge, anticipate no OT follow up needs.     Recommendations for follow up therapy are one component of a multi-disciplinary discharge planning process, led by the attending physician.  Recommendations may be updated based on patient status, additional functional criteria and insurance authorization.   Follow Up Recommendations  No OT follow  up;Supervision - Intermittent    Equipment Recommendations  3 in 1 bedside commode    Recommendations for Other Services       Precautions / Restrictions Precautions Precautions: None Restrictions Weight Bearing Restrictions: No      Mobility Bed Mobility Overal bed mobility: Needs Assistance Bed Mobility: Sidelying to Sit;Rolling;Sit to Sidelying Rolling: Min guard Sidelying to sit: Min guard     Sit to sidelying: Min guard General bed mobility comments: initial MOD A log rolling to sitting, improved to SUPERVISISON c VCs second trial    Transfers Overall transfer level: Needs assistance Equipment used: 1 person hand held assist Transfers: Sit to/from [facility] Sit to Stand: Min assist;From elevated surface Stand pivot transfers: Min assist       General transfer comment: slow movements, requires cues for pain mgmt    Balance Overall balance assessment: Needs assistance Sitting-balance support: No upper extremity supported;Feet supported Sitting balance-Leahy Scale: Good     Standing balance support: No upper extremity supported;During functional activity Standing balance-Leahy Scale: Fair                             ADL either performed or assessed with clinical judgement   ADL Overall ADL's : Needs assistance/impaired                                       General ADL Comments: MIN A + HHA for BSC t/f, SETUP pericare c lateral leans. MAX A underwear mgmt in standing for periaccess. SETUP seated grooming tasks, unable  to tolerate standing grooming task 2/2 pain      Pertinent Vitals/Pain Pain Assessment: 0-10 Pain Score: 10-Worst pain ever Pain Location: low back Pain Descriptors / Indicators: Constant;Grimacing;Guarding;Moaning;Sharp;Shooting;Spasm;Radiating Pain Intervention(s): Limited activity within patient's tolerance;Monitored during session;Premedicated before session;Repositioned;Patient requesting  pain meds-RN notified;Ice applied     Hand Dominance Right   Extremity/Trunk Assessment Upper Extremity Assessment Upper Extremity Assessment: Overall WFL for tasks assessed   Lower Extremity Assessment Lower Extremity Assessment: Generalized weakness       Communication Communication Communication: No difficulties   Cognition Arousal/Alertness: Awake/alert Behavior During Therapy: WFL for tasks assessed/performed Overall Cognitive Status: Within Functional Limits for tasks assessed                                     General Comments       Exercises Exercises: Other exercises Other Exercises Other Exercises: Pt educated re: OT role, DME recs, d/c recs, falls prevention, adapted dressing techniques, functional application of back precations Other Exercises: LBD, toileting, log rolling x2, sit<>stand, SPT, sitting/standing balance/tolerance   Shoulder Instructions      Home Living Family/patient expects to be discharged to:: Private residence Living Arrangements: Spouse/significant other Available Help at Discharge: Family;Available PRN/intermittently (husband works full time 2pm-midnight shift) Type of Home: House Home Access: Level entry     Home Layout: One level     Bathroom Shower/Tub: Aeronautical engineer: Wheelchair - manual          Prior Functioning/Environment Level of Independence: Independent        Comments: Pt works full time as Educational psychologist on weekends, 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 Has w/c from ankle fx 03/2020 for community mobility distances        OT Problem List: Decreased range of motion;Decreased strength;Decreased activity tolerance;Pain      OT Treatment/Interventions: Self-care/ADL training;Therapeutic exercise;Energy conservation;DME and/or AE instruction;Therapeutic activities;Patient/family education;Balance training    OT Goals(Current goals can be found in the care plan section)  Acute Rehab OT Goals Patient Stated Goal: to improve pain OT Goal Formulation: With patient/family Time For Goal Achievement: 04/13/21 Potential to Achieve Goals: Good ADL Goals Pt Will Perform Grooming: Independently;standing Pt Will Perform Lower Body Dressing: with modified independence;sitting/lateral leans;with adaptive equipment;with caregiver independent in assisting Additional ADL Goal #1: Pt will Independetly verbalize plan to implement x3 pain management strategies  OT Frequency: Min 1X/week    AM-PAC OT "6 Clicks" Daily Activity     Outcome Measure Help from another person eating meals?: None Help from another person taking care of personal grooming?: A Little Help from another person toileting, which includes using toliet, bedpan, or urinal?: A Little Help from another person bathing (including washing, rinsing, drying)?: A Little Help from another person to put on and taking off regular upper body clothing?: None Help from another person to put on and taking off regular lower body clothing?: A Little 6 Click Score: 20   End of Session Nurse Communication: Patient requests pain meds  Activity Tolerance: Patient tolerated treatment well;Patient limited by pain Patient left: in bed;with call bell/phone within reach;with family/visitor present  OT Visit Diagnosis: Other abnormalities of gait and mobility (R26.89);Muscle weakness (generalized) (M62.81);Pain Pain - part of body:  (back)                Time: 6222-9798 (9211-9417) OT Time Calculation (min): 29 min  Charges:  OT General Charges $OT Visit: 1 Visit OT Evaluation $OT Eval Moderate Complexity: 1 Mod OT Treatments $Self Care/Home Management : 23-37 mins  Dessie Coma, M.S. OTR/L  03/30/21, 1:53 PM  ascom [phone]

## 2021-03-30 NOTE — Progress Notes (Addendum)
Patient complaining of back pain. Patient to be discharged. This nurse notified Dr. Manuella Ghazi to get order for PO pain med. Percocet given at 1524. Patient had no relief with Percocet. Dr. Manuella Ghazi states to give Dilaudid 104m IV and discharge if patient's pain is better. Dilaudid given per MD at 1655. After 30 mins, patient pain 6 out of 10. Dr. SManuella Ghazinotified. MD states to go ahead and discharge patient. Patient states she is ready to go home.

## 2021-03-30 NOTE — Discharge Summary (Addendum)
[place] [facility] [place] NAME: Frances Walton    MR#:  [id number]  DATE OF BIRTH:  [birth year]  DATE OF ADMISSION:  03/29/2021   ADMITTING PHYSICIAN: Christel Mormon, MD  DATE OF DISCHARGE: 03/30/2021  6:15 PM  PRIMARY CARE PHYSICIAN: Lorelee Market, MD   ADMISSION DIAGNOSIS:  Bronchitis [J40] Intractable low back pain [M54.59] Acute bilateral low back pain with bilateral sciatica [M54.42, M54.41] DISCHARGE DIAGNOSIS:  Active Problems:   Intractable low back pain   Acute bilateral low back pain with bilateral sciatica   Bronchitis  SECONDARY DIAGNOSIS:  History reviewed. No pertinent past medical history. HOSPITAL COURSE:  25 y f with h/o depression, IBS was admitted for acute onset low back pain  Acute onset low back pain: likely due to musculo-skeletal etio. MRI neg for any significant disc dz or stenosis. Neurosurgery seen and recommends conservative mgmt. - LSO brace in place - d/ded on flexeril, tramadol, zofran, hycodan - outpt PT if patient agreeable. - outpt physiatry & PCP f/up - rest - BSC provided. - d/w patient, her husband and mother who are agreeable with D/C plans.   DISCHARGE CONDITIONS:  stable CONSULTS OBTAINED:   DRUG ALLERGIES:   Allergies  Allergen Reactions   Latex Rash   Nickel Hives   DISCHARGE MEDICATIONS:   Allergies as of 03/30/2021       Reactions   Latex Rash   Nickel Hives        Medication List     STOP taking these medications    fluconazole 150 MG tablet Commonly known as: Diflucan   ondansetron 8 MG tablet Commonly known as: Zofran   oxyCODONE-acetaminophen 7.5-325 MG tablet Commonly known as: Percocet   phenazopyridine 200 MG tablet Commonly known as: Pyridium   sulfamethoxazole-trimethoprim 800-160 MG tablet Commonly known as: BACTRIM DS       TAKE these medications    acetaminophen 325 MG tablet Commonly known as: TYLENOL Take 2 tablets (650 mg total) by mouth every 6 (six)  hours as needed for mild pain (or Fever >/= 101).   buPROPion 150 MG 24 hr tablet Commonly known as: WELLBUTRIN XL Take 150 mg by mouth daily.   cyclobenzaprine 10 MG tablet Commonly known as: FLEXERIL Take 1 tablet (10 mg total) by mouth 3 (three) times daily as needed for muscle spasms.   guaiFENesin-dextromethorphan 100-10 MG/5ML syrup Commonly known as: ROBITUSSIN DM Take 5 mLs by mouth every 4 (four) hours as needed for cough.   HYDROcodone bit-homatropine 5-1.5 MG/5ML syrup Commonly known as: HYCODAN Take 5 mLs by mouth every 6 (six) hours as needed for cough.   mesalamine 1.2 g EC tablet Commonly known as: LIALDA Take 1.2 g by mouth daily with breakfast.   traMADol 50 MG tablet Commonly known as: Ultram Take 1 tablet (50 mg total) by mouth every 6 (six) hours as needed for up to 7 days.               Durable Medical Equipment  (From admission, onward)           Start     Ordered   03/30/21 1320  For home use only DME Bedside commode  Once       Question:  Patient needs a bedside commode to treat with the following condition  Answer:  Ambulatory dysfunction   03/30/21 1319           DISCHARGE INSTRUCTIONS:   DIET:  Regular diet DISCHARGE CONDITION:  Stable ACTIVITY:  Activity as tolerated OXYGEN:  Home Oxygen: No.  Oxygen Delivery: room air DISCHARGE LOCATION:  Home       If you experience worsening of your admission symptoms, develop shortness of breath, life threatening emergency, suicidal or homicidal thoughts you must seek medical attention immediately by calling 911 or calling your MD immediately  if symptoms less severe.  You Must read complete instructions/literature along with all the possible adverse reactions/side effects for all the Medicines you take and that have been prescribed to you. Take any new Medicines after you have completely understood and accpet all the possible adverse reactions/side effects.   Please note  You were  cared for by a hospitalist during your hospital stay. If you have any questions about your discharge medications or the care you received while you were in the hospital after you are discharged, you can call the unit and asked to speak with the hospitalist on call if the hospitalist that took care of you is not available. Once you are discharged, your primary care physician will handle any further medical issues. Please note that NO REFILLS for any discharge medications will be authorized once you are discharged, as it is imperative that you return to your primary care physician (or establish a relationship with a primary care physician if you do not have one) for your aftercare needs so that they can reassess your need for medications and monitor your lab values.    On the day of Discharge:  VITAL SIGNS:  Blood pressure 119/83, pulse 90, temperature 98 F (36.7 C), resp. rate 14, height 5' 11"  (1.803 m), weight 81.6 kg, last menstrual period 03/13/2021, SpO2 99 %. PHYSICAL EXAMINATION:  GENERAL:  25 y.o.-year-old patient lying in the bed with no acute distress.  EYES: Pupils equal, round, reactive to light and accommodation. No scleral icterus. Extraocular muscles intact.  HEENT: Head atraumatic, normocephalic. Oropharynx and nasopharynx clear.  NECK:  Supple, no jugular venous distention. No thyroid enlargement, no tenderness.  LUNGS: Normal breath sounds bilaterally, no wheezing, rales,rhonchi or crepitation. No use of accessory muscles of respiration.  CARDIOVASCULAR: S1, S2 normal. No murmurs, rubs, or gallops.  ABDOMEN: Soft, non-tender, non-distended. Bowel sounds present. No organomegaly or mass.  EXTREMITIES: No pedal edema, cyanosis, or clubbing.  NEUROLOGIC: Cranial nerves II through XII are intact. Muscle strength 5/5 in all extremities. Sensation intact. Gait not checked.  PSYCHIATRIC: The patient is emotional at times due to pain in back SKIN: No obvious rash, lesion, or ulcer.   DATA REVIEW:   CBC Recent Labs  Lab 03/30/21 0617  WBC 10.9*  HGB 12.1  HCT 37.1  PLT 273    Chemistries  Recent Labs  Lab 03/29/21 1304  NA 138  K 4.0  CL 103  CO2 22  GLUCOSE 77  BUN 11  CREATININE 0.61  CALCIUM 9.4  AST 23  ALT 15  ALKPHOS 70  BILITOT 1.4*     Outpatient follow-up  Follow-up Information     Lorelee Market, MD. Go on 04/05/2021.   Specialty: Family Medicine Why: [facility] Discharge F/UP (Appt w/ Dr. Celene Squibb @ 1:20 pm Contact information: [place] [address] [phone]         Meeler, Sherren Kerns, FNP. Go on 04/06/2021.   Specialty: Family Medicine Why: [facility] Discharge F/UP - back pain; (appt w/ Dr. Alba Destine) Appt @ 11:00 am Contact information: [place] [address] [phone]  Management plans discussed with the patient, family and they are in agreement.  CODE STATUS: Full Code   TOTAL TIME TAKING CARE OF THIS PATIENT: 45 minutes.    Max Sane M.D on 03/30/2021 at 8:26 PM  [facility]   CC: Primary care physician; Lorelee Market, MD   Note: This dictation was prepared with Dragon dictation along with smaller phrase technology. Any transcriptional errors that result from this process are unintentional.

## 2021-03-30 NOTE — Progress Notes (Signed)
Orthopedic Tech Progress Note Patient Details:  Brandice Busser 11/14/1995 [id number]  Called in order to [facility] for a LSO BRACE   Patient ID: Huxley Vanwagoner, female   DOB: 01/14/1996, 25 y.o.   MRN: [id number]  Janit Pagan 03/30/2021, 12:11 PM

## 2021-03-30 NOTE — Evaluation (Signed)
Physical Therapy Evaluation Patient Details Name: Frances Walton MRN: [id number] DOB: 01/05/1996 Today's Date: 03/30/2021  History of Present Illness  Frances Walton is a 25 y.o. Caucasian female with medical history significant for IBS and depression, who presented to the emergency room with acute onset of bilateral low back pain that started suddenly this morning when she was forcefully coughing.    Clinical Impression  Pt lying supine upon arrival and agreeable to therapy.  Pt's LSO brace arrived and with assistance from orthotic fitter able to don brace on pt.  Pt noted some pain relief, however still writhing in pain.  Pt agreeable to some exercises with therapy including lower trunk rotation to each side x multiple bouts, posterior pelvic tilts x multiple bouts, and TrA activation in supine positioning.  Pt notes some increase pain with all exercises.  Pt however did respond to therapeutic activity involving STM to the lumbar region in sidelying position.  Pt also noted some increase pain during unilateral PA's to the lumbar spine in sidelying position as well.  Pt notes decreased pain following activities, however is still in immense pain.  Brace was donned upon leaving the room and nursing staff noted of pain levels currently.  Pt would benefit from seeing outpatient PT upon discharge from hospital setting.  Pt would continue to benefit from [facility] and modalities to decrease pain and increase mobility of the lumbar spine.         Recommendations for follow up therapy are one component of a multi-disciplinary discharge planning process, led by the attending physician.  Recommendations may be updated based on patient status, additional functional criteria and insurance authorization.  Follow Up Recommendations Outpatient PT    Equipment Recommendations  3in1 (PT)    Recommendations for Other Services       Precautions / Restrictions Precautions Precautions: None Restrictions Weight  Bearing Restrictions: No      Mobility  Bed Mobility Overal bed mobility: Needs Assistance Bed Mobility: Rolling Rolling: Min guard Sidelying to sit: Min guard     Sit to sidelying: Min guard General bed mobility comments: pt performed rolling into sidelying position for therapist to perform STM.    Transfers Overall transfer level: Needs assistance Equipment used: 1 person hand held assist Transfers: Sit to/from [facility] Sit to Stand: Min assist;From elevated surface Stand pivot transfers: Min assist       General transfer comment: deferred due to pain levels currently.  Ambulation/Gait                Stairs            Wheelchair Mobility    Modified Rankin (Stroke Patients Only)       Balance Overall balance assessment: Needs assistance Sitting-balance support: No upper extremity supported;Feet supported Sitting balance-Leahy Scale: Good     Standing balance support: No upper extremity supported;During functional activity Standing balance-Leahy Scale: Fair                               Pertinent Vitals/Pain Pain Assessment: 0-10 Pain Score: 8  Pain Location: low back following treatment Pain Descriptors / Indicators: Constant;Grimacing;Guarding;Moaning;Sharp;Shooting;Spasm;Radiating Pain Intervention(s): Limited activity within patient's tolerance;Monitored during session;Premedicated before session;Repositioned;Patient requesting pain meds-RN notified;Relaxation;Utilized relaxation techniques;Other (comment) (LSO Brace applied.)    Home Living Family/patient expects to be discharged to:: Private residence Living Arrangements: Spouse/significant other Available Help at Discharge: Family;Available PRN/intermittently (husband works full time 2pm-midnight shift) Type  of Home: House Home Access: Level entry     Home Layout: One level Home Equipment: Wheelchair - manual      Prior Function Level of  Independence: Independent         Comments: Pt works full time as Educational psychologist on weekends, 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 Has w/c from ankle fx 03/2020 for community mobility distances     Hand Dominance   Dominant Hand: Right    Extremity/Trunk Assessment   Upper Extremity Assessment Upper Extremity Assessment: Overall WFL for tasks assessed    Lower Extremity Assessment Lower Extremity Assessment: Generalized weakness       Communication   Communication: No difficulties  Cognition Arousal/Alertness: Awake/alert Behavior During Therapy: WFL for tasks assessed/performed Overall Cognitive Status: Within Functional Limits for tasks assessed                                        General Comments      Exercises Other Exercises Other Exercises: Pt educated re: OT role, DME recs, d/c recs, falls prevention, adapted dressing techniques, functional application of back precations Other Exercises: LBD, toileting, log rolling x2, sit<>stand, SPT, sitting/standing balance/tolerance Other Exercises: Pt edcuated on log roll and LSO brace donning/doffing for pain management.  Pt also educated in LTR exercises and posterior pelvic tilts for mobility of the lumbar spine.  Pt agreeable to exercises and was able to perform with pain, but with good technique.   Assessment/Plan    PT Assessment Patient needs continued PT services  PT Problem List Decreased activity tolerance;Decreased mobility;Pain       PT Treatment Interventions Manual techniques;Therapeutic exercise;Therapeutic activities    PT Goals (Current goals can be found in the Care Plan section)  Acute Rehab PT Goals Patient Stated Goal: to improve pain PT Goal Formulation: With patient/family Time For Goal Achievement: 04/13/21 Potential to Achieve Goals: Fair    Frequency Min 2X/week   Barriers to discharge        Co-evaluation               AM-PAC PT "6 Clicks" Mobility   Outcome Measure Help needed turning from your back to your side while in a flat bed without using bedrails?: A Lot Help needed moving from lying on your back to sitting on the side of a flat bed without using bedrails?: A Lot Help needed moving to and from a bed to a chair (including a wheelchair)?: A Lot Help needed standing up from a chair using your arms (e.g., wheelchair or bedside chair)?: A Lot Help needed to walk in hospital room?: Total Help needed climbing 3-5 steps with a railing? : Total 6 Click Score: 10    End of Session Equipment Utilized During Treatment: Back brace (LSO) Activity Tolerance: Patient tolerated treatment well;Patient limited by pain Patient left: in bed;with call bell/phone within reach;with family/visitor present Nurse Communication: Mobility status;Patient requests pain meds PT Visit Diagnosis: Pain Pain - part of body:  (Low back.)    Time: 5053-9767 PT Time Calculation (min) (ACUTE ONLY): 56 min   Charges:   PT Evaluation $PT Eval Low Complexity: 1 Low PT Treatments $Therapeutic Exercise: 8-22 mins $Therapeutic Activity: 23-37 mins        Gwenlyn Saran, PT, DPT 03/30/21, 3:44 PM   Christie Nottingham 03/30/2021, 3:38 PM

## 2021-03-31 ENCOUNTER — Other Ambulatory Visit: Payer: Self-pay | Admitting: Internal Medicine

## 2021-03-31 MED ORDER — OXYCODONE-ACETAMINOPHEN 7.5-325 MG PO TABS: 1.0000 | ORAL_TABLET | Freq: Three times a day (TID) | ORAL | 0 refills | Status: AC | PRN

## 2021-03-31 MED ORDER — TRAMADOL HCL 50 MG PO TABS: 50.0000 mg | ORAL_TABLET | Freq: Four times a day (QID) | ORAL | 0 refills | Status: AC | PRN

## 2022-05-24 ENCOUNTER — Encounter: Payer: Medicaid Other | Admitting: Obstetrics

## 2022-07-22 ENCOUNTER — Encounter: Payer: Medicaid Other | Admitting: Advanced Practice Midwife

## 2022-08-10 ENCOUNTER — Ambulatory Visit: Payer: Medicaid Other | Admitting: Dermatology

## 2022-08-18 ENCOUNTER — Encounter: Payer: Medicaid Other | Admitting: Advanced Practice Midwife

## 2022-08-29 ENCOUNTER — Encounter: Payer: Self-pay | Admitting: Licensed Practical Nurse

## 2022-08-29 ENCOUNTER — Other Ambulatory Visit (HOSPITAL_COMMUNITY)
Admission: RE | Admit: 2022-08-29 | Discharge: 2022-08-29 | Disposition: A | Discharge status: Home / Self Care | Payer: Medicaid Other | Source: Ambulatory Visit | Attending: Advanced Practice Midwife | Admitting: Advanced Practice Midwife

## 2022-08-29 ENCOUNTER — Ambulatory Visit: Payer: Medicaid Other | Admitting: Licensed Practical Nurse

## 2022-08-29 VITALS — BP 110/76 | HR 89 | Ht 71.0 in | Wt 190.1 lb

## 2022-08-29 DIAGNOSIS — L292 Pruritus vulvae: Secondary | ICD-10-CM | POA: Insufficient documentation

## 2022-08-29 MED ORDER — NYSTATIN-TRIAMCINOLONE 100000-0.1 UNIT/GM-% EX OINT: 1.0000 | TOPICAL_OINTMENT | Freq: Two times a day (BID) | CUTANEOUS | 0 refills | Status: DC

## 2022-08-29 NOTE — Progress Notes (Signed)
SUBJECTIVE: Here for evaluation of vulvar itching and also has not seen an OB in "sometime". Pt has had intense vulvar itching for the last 3 months, it occurs daily, she feels the need to scratch and now has caused some scarring, she has tried cortisone cream which helps temporarily. She has not noticed any changes to her discharge. She does use a vagisil wash.   She also gets a boil on her thigh frequently, currently it is not bothering her   She is sexually active, has an IUD in place, it was inserted about 2 years ago. Her last pap was 2 years ago in [place].   OBJECTIVE: BP 110/76   Pulse 89   Ht '5\' 11"'$  (1.803 m)   Wt 190 lb 1.6 oz (86.2 kg)   LMP 08/07/2022 (Exact Date)   BMI 26.51 kg/m   Gen: NAD External:  redness and irritation noted on labia majora and along the mon pubis, scleriatic tissue noted at the topmost part of the vulva. Remainder of genitalia WNL,   ASSESSMENT: Vulvar itching  PLAN: -Aptima swab collected  -Mycolog cream ordered -Rec washing thighs once a week with antibacterial soaps to decrease the number of occurences of bois -RTC in 4 weeks for fu and annual exam.   Roberto Scales, [facility]  08/29/22  11:48 AM

## 2022-08-30 LAB — CERVICOVAGINAL ANCILLARY ONLY
Candida Glabrata: NEGATIVE
Candida Vaginitis: POSITIVE — AB
Comment: NEGATIVE
Comment: NEGATIVE

## 2022-09-05 ENCOUNTER — Telehealth: Payer: Self-pay

## 2022-09-05 DIAGNOSIS — L292 Pruritus vulvae: Secondary | ICD-10-CM

## 2022-09-05 MED ORDER — NYSTATIN-TRIAMCINOLONE 100000-0.1 UNIT/GM-% EX OINT: 1.0000 | TOPICAL_OINTMENT | Freq: Two times a day (BID) | CUTANEOUS | 0 refills | Status: AC

## 2022-09-05 NOTE — Telephone Encounter (Signed)
PA has been submitted for Nystatin-Triamcinlone ointment.

## 2022-09-05 NOTE — Telephone Encounter (Signed)
PA was approved. 

## 2022-09-05 NOTE — Telephone Encounter (Signed)
Pt called reporting her rx was not at the pharmacy in [place]. I resent the rx. Pt aware.

## 2022-09-26 ENCOUNTER — Ambulatory Visit: Payer: Medicaid Other | Admitting: Obstetrics
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
## 2023-09-02 DIAGNOSIS — Z8719 Personal history of other diseases of the digestive system: Secondary | ICD-10-CM

## 2023-09-02 HISTORY — DX: Personal history of other diseases of the digestive system: Z87.19

## 2024-05-13 ENCOUNTER — Encounter

## 2024-06-11 ENCOUNTER — Encounter: Payer: Self-pay | Admitting: Internal Medicine

## 2024-06-18 ENCOUNTER — Encounter

## 2024-06-18 HISTORY — DX: Attention-deficit hyperactivity disorder, unspecified type: F90.9

## 2024-06-18 HISTORY — DX: Depression, unspecified: F32.A

## 2024-07-16 ENCOUNTER — Ambulatory Visit: Admission: RE | Admit: 2024-07-16 | Source: Home / Self Care | Admitting: Internal Medicine

## 2024-07-16 ENCOUNTER — Encounter: Payer: Self-pay | Admitting: Internal Medicine

## 2024-07-16 ENCOUNTER — Ambulatory Visit: Payer: Self-pay

## 2024-07-16 ENCOUNTER — Encounter
Admission: RE | Disposition: A | Discharge status: Home / Self Care | Payer: Self-pay | Source: Home / Self Care | Attending: Internal Medicine

## 2024-07-16 DIAGNOSIS — K5150 Left sided colitis without complications: Secondary | ICD-10-CM | POA: Insufficient documentation

## 2024-07-16 DIAGNOSIS — Z79899 Other long term (current) drug therapy: Secondary | ICD-10-CM | POA: Insufficient documentation

## 2024-07-16 DIAGNOSIS — F32A Depression, unspecified: Secondary | ICD-10-CM | POA: Diagnosis not present

## 2024-07-16 DIAGNOSIS — F17210 Nicotine dependence, cigarettes, uncomplicated: Secondary | ICD-10-CM | POA: Insufficient documentation

## 2024-07-16 DIAGNOSIS — K640 First degree hemorrhoids: Secondary | ICD-10-CM | POA: Diagnosis not present

## 2024-07-16 HISTORY — DX: Depression, unspecified: F32.A

## 2024-07-16 HISTORY — DX: Attention-deficit hyperactivity disorder, unspecified type: F90.9

## 2024-07-16 HISTORY — PX: BIOPSY OF SKIN SUBCUTANEOUS TISSUE AND/OR MUCOUS MEMBRANE: SHX6741

## 2024-07-16 HISTORY — PX: COLONOSCOPY: SHX5424

## 2024-07-16 LAB — POCT PREGNANCY, URINE: Preg Test, Ur: NEGATIVE

## 2024-07-16 SURGERY — COLONOSCOPY
Anesthesia: General

## 2024-07-16 MED ORDER — LIDOCAINE HCL (CARDIAC) PF 100 MG/5ML IV SOSY
PREFILLED_SYRINGE | INTRAVENOUS | Status: DC | PRN
  Administered 2024-07-16: 80 mg via INTRAVENOUS

## 2024-07-16 MED ORDER — DEXMEDETOMIDINE HCL IN NACL 80 MCG/20ML IV SOLN
INTRAVENOUS | Status: DC | PRN
  Administered 2024-07-16: 12 ug via INTRAVENOUS
  Administered 2024-07-16: 8 ug via INTRAVENOUS

## 2024-07-16 MED ORDER — SODIUM CHLORIDE 0.9 % IV SOLN: INTRAVENOUS | Status: DC

## 2024-07-16 MED ORDER — PROPOFOL 10 MG/ML IV BOLUS
INTRAVENOUS | Status: DC | PRN
  Administered 2024-07-16 (×4): 50 mg via INTRAVENOUS

## 2024-07-16 MED ORDER — PROPOFOL 500 MG/50ML IV EMUL
INTRAVENOUS | Status: DC | PRN
  Administered 2024-07-16: 75 ug/kg/min via INTRAVENOUS

## 2024-07-16 MED ORDER — LIDOCAINE HCL (PF) 2 % IJ SOLN
INTRAMUSCULAR | Status: AC
  Filled 2024-07-16: qty 5

## 2024-07-16 NOTE — Anesthesia Postprocedure Evaluation (Signed)
"   Anesthesia Post Note  Patient: Psychologist, Occupational  Procedure(s) Performed: COLONOSCOPY BIOPSY, GI  Patient location during evaluation: Endoscopy Anesthesia Type: General Level of consciousness: awake and alert Pain management: pain level controlled Vital Signs Assessment: post-procedure vital signs reviewed and stable Respiratory status: spontaneous breathing, nonlabored ventilation, respiratory function stable and patient connected to nasal cannula oxygen Cardiovascular status: blood pressure returned to baseline and stable Postop Assessment: no apparent nausea or vomiting Anesthetic complications: no   No notable events documented.   Last Vitals:  Vitals:   07/16/24 0907 07/16/24 0922  BP: 107/63 (!) 106/58  Pulse: 82 64  Resp: 14 12  Temp: (!) 35.8 C   SpO2: 99% 100%    Last Pain:  Vitals:   07/16/24 0907  TempSrc: Temporal  PainSc: 0-No pain                 Frances Walton      "
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
## 2024-07-16 NOTE — Transfer of Care (Signed)
 Immediate Anesthesia Transfer of Care Note  Patient: Frances Walton  Procedure(s) Performed: COLONOSCOPY  Patient Location: PACU  Anesthesia Type:General  Level of Consciousness: sedated  Airway & Oxygen Therapy: Patient Spontanous Breathing  Post-op Assessment: Report given to RN and Post -op Vital signs reviewed and stable  Post vital signs: Reviewed and stable  Last Vitals:  Vitals Value Taken Time  BP 107/63 07/16/24 09:07  Temp    Pulse 79 07/16/24 09:08  Resp 20 07/16/24 09:08  SpO2 99 % 07/16/24 09:08  Vitals shown include unfiled device data.  Last Pain:  Vitals:   07/16/24 0822  TempSrc: Temporal         Complications: No notable events documented.

## 2024-07-16 NOTE — Anesthesia Preprocedure Evaluation (Signed)
"                                    Anesthesia Evaluation  Patient identified by MRN, date of birth, ID band Patient awake    Reviewed: Allergy & Precautions, NPO status , Patient's Chart, lab work & pertinent test results  History of Anesthesia Complications Negative for: history of anesthetic complications  Airway Mallampati: II  TM Distance: >3 FB Neck ROM: full    Dental no notable dental hx.    Pulmonary neg pulmonary ROS, Current Smoker and Patient abstained from smoking.   Pulmonary exam normal        Cardiovascular negative cardio ROS Normal cardiovascular exam     Neuro/Psych  PSYCHIATRIC DISORDERS  Depression     Neuromuscular disease    GI/Hepatic Neg liver ROS, PUD,,,  Endo/Other  negative endocrine ROS    Renal/GU negative Renal ROS  negative genitourinary   Musculoskeletal   Abdominal   Peds  Hematology negative hematology ROS (+)   Anesthesia Other Findings Past Medical History: No date: ADHD (attention deficit hyperactivity disorder) No date: Depression 09/2023: History of colitis     Comment:  Left sided ulcerative colitis without complication  No past surgical history on file.     Reproductive/Obstetrics negative OB ROS                              Anesthesia Physical Anesthesia Plan  ASA: 2  Anesthesia Plan: General   Post-op Pain Management: Minimal or no pain anticipated   Induction: Intravenous  PONV Risk Score and Plan: 2 and Propofol  infusion and TIVA  Airway Management Planned: Natural Airway and Nasal Cannula  Additional Equipment:   Intra-op Plan:   Post-operative Plan:   Informed Consent: I have reviewed the patients History and Physical, chart, labs and discussed the procedure including the risks, benefits and alternatives for the proposed anesthesia with the patient or authorized representative who has indicated his/her understanding and acceptance.     Dental Advisory  Given  Plan Discussed with: Anesthesiologist, CRNA and Surgeon  Anesthesia Plan Comments: (Patient consented for risks of anesthesia including but not limited to:  - adverse reactions to medications - risk of airway placement if required - damage to eyes, teeth, lips or other oral mucosa - nerve damage due to positioning  - sore throat or hoarseness - Damage to heart, brain, nerves, lungs, other parts of body or loss of life  Patient voiced understanding and assent.)        Anesthesia Quick Evaluation  "

## 2024-07-16 NOTE — Op Note (Signed)
 [facility] Patient Name: Frances Walton Procedure Date: 07/16/2024 8:42 AM MRN: [id number] Account #: [id number] Date of Birth: [birth year] Admit Type: Outpatient Age: 29 Room: [facility] ENDO ROOM 1 Gender: Female Note Status: Finalized Instrument Name: Colon Scope [id number] Procedure:             Colonoscopy Indications:           Diarrhea (presumed secondary to ulcerative colitis),                         Left-sided chronic ulcerative colitis Providers:             Bertram Haddix K. Aundria MD, MD Referring MD:          Albertine Hikes (Referring MD) Medicines:             Propofol  per Anesthesia Complications:         No immediate complications. Estimated blood loss:                         Minimal. Procedure:             Pre-Anesthesia Assessment:                        - The risks and benefits of the procedure and the                         sedation options and risks were discussed with the                         patient. All questions were answered and informed                         consent was obtained.                        - Patient identification and proposed procedure were                         verified prior to the procedure by the nurse. The                         procedure was verified in the procedure room.                        - ASA Grade Assessment: II - A patient with mild                         systemic disease.                        - After reviewing the risks and benefits, the patient                         was deemed in satisfactory condition to undergo the                         procedure.                        After obtaining informed consent, the colonoscope  was                         passed under direct vision. Throughout the procedure,                         the patient's blood pressure, pulse, and oxygen                         saturations were monitored continuously. The                         Colonoscope was  introduced through the anus and                         advanced to the the cecum, identified by appendiceal                         orifice and ileocecal valve. The colonoscopy was                         performed without difficulty. The patient tolerated                         the procedure well. The quality of the bowel                         preparation was good. The terminal ileum, ileocecal                         valve, appendiceal orifice, and rectum were                         photographed. Findings:      The perianal and digital rectal examinations were normal. Pertinent       negatives include normal sphincter tone and no palpable rectal lesions.      Non-bleeding internal hemorrhoids were found during retroflexion. The       hemorrhoids were Grade I (internal hemorrhoids that do not prolapse).      The terminal ileum appeared normal. Biopsies were taken with a cold       forceps for histology. Estimated blood loss was minimal.      Patchy mild inflammation characterized by erythema was found in the       sigmoid colon, in the descending colon, in the transverse colon, in the       ascending colon and in the cecum. Two biopsies were obtained with cold       forceps for histology in the cecum, as well as four biopsies in the       ascending colon, two biopsies in the transverse colon and two biopsies       in the descending colon. Biopsies were taken with a cold forceps for       histology. Biopsies were taken with a cold forceps for histology.       Biopsies were taken with a cold forceps for histology. Two biopsies were       obtained with cold forceps for histology in the sigmoid colon. Estimated       blood loss was minimal.      Normal mucosa was found  in the rectum. Biopsies were taken with a cold       forceps for histology. Estimated blood loss was minimal.      The exam was otherwise without abnormality. Impression:            - Non-bleeding internal hemorrhoids.                         - The examined portion of the ileum was normal.                         Biopsied.                        - Patchy mild inflammation was found in the sigmoid                         colon, in the descending colon, in the transverse                         colon, in the ascending colon and in the cecum                         secondary to colitis. Biopsied.                        - Normal mucosa in the rectum. Biopsied.                        - The examination was otherwise normal.                        - Biopsies performed in the cecum, in the ascending                         colon, in the transverse colon and in the descending                         colon.                        - Biopsies performed in the sigmoid colon. Recommendation:        - Patient has a contact number available for                         emergencies. The signs and symptoms of potential                         delayed complications were discussed with the patient.                         Return to normal activities tomorrow. Written                         discharge instructions were provided to the patient.                        - Resume previous diet.                        -  Continue present medications.                        - Await pathology results.                        - Follow up with Jonette Primmer, PA-C in the GI office.                         [phone]                        - Telephone GI office to schedule appointment at                         appointment to be scheduled.                        - The findings and recommendations were discussed with                         the patient. Procedure Code(s):     --- Professional ---                        [phone], Colonoscopy, flexible; with biopsy, single or                         multiple Diagnosis Code(s):     --- Professional ---                        K51.50, Left sided colitis without complications                         R19.7, Diarrhea, unspecified                        K52.9, Noninfective gastroenteritis and colitis,                         unspecified                        K64.0, First degree hemorrhoids CPT copyright 2022 [employer]. All rights reserved. The codes documented in this report are preliminary and upon coder review may  be revised to meet current compliance requirements. Ladell MARLA Boss MD, MD 07/16/2024 9:11:21 AM This report has been signed electronically. Number of Addenda: 0 Note Initiated On: 07/16/2024 8:42 AM Scope Withdrawal Time: 0 hours 10 minutes 23 seconds  Total Procedure Duration: 0 hours 14 minutes 31 seconds  Estimated Blood Loss:  Estimated blood loss was minimal.      [facility]

## 2024-07-16 NOTE — Interval H&P Note (Signed)
 History and Physical Interval Note:  07/16/2024 8:34 AM  Frances Walton  has presented today for surgery, with the diagnosis of Left sided ulcerative colitis without complication (CMS/HHS-HCC) [K51.50].  The various methods of treatment have been discussed with the patient and family. After consideration of risks, benefits and other options for treatment, the patient has consented to  Procedures: COLONOSCOPY (N/A) as a surgical intervention.  The patient's history has been reviewed, patient examined, no change in status, stable for surgery.  I have reviewed the patient's chart and labs.  Questions were answered to the patient's satisfaction.     Milliken, Kacyn Souder

## 2024-07-17 LAB — SURGICAL PATHOLOGY
